# Patient Record
Sex: Female | Born: 1960
Health system: Southern US, Community
[De-identification: ages and names within clinical notes are randomized; demographics above are authoritative.]

## PROBLEM LIST (undated history)

## (undated) DIAGNOSIS — E785 Hyperlipidemia, unspecified: Secondary | ICD-10-CM

## (undated) HISTORY — PX: TOTAL VAGINAL HYSTERECTOMY: SHX2548

## (undated) HISTORY — DX: Hyperlipidemia, unspecified: E78.5

## (undated) HISTORY — PX: TUBAL LIGATION: SHX77

## (undated) HISTORY — PX: OOPHORECTOMY: SHX86

## (undated) HISTORY — PX: VAGINAL HYSTERECTOMY: SUR661

## (undated) HISTORY — PX: MYOMECTOMY: SHX85

---

## 2005-02-14 ENCOUNTER — Emergency Department (HOSPITAL_COMMUNITY): Admission: EM | Admit: 2005-02-14 | Discharge: 2005-02-14 | Payer: Self-pay | Admitting: Emergency Medicine

## 2007-08-07 ENCOUNTER — Ambulatory Visit: Payer: Self-pay | Admitting: Endocrinology

## 2007-10-15 ENCOUNTER — Ambulatory Visit: Payer: Self-pay | Admitting: Unknown Physician Specialty

## 2007-10-16 ENCOUNTER — Inpatient Hospital Stay: Payer: Self-pay | Admitting: Unknown Physician Specialty

## 2012-02-29 ENCOUNTER — Ambulatory Visit: Payer: Self-pay

## 2014-10-05 ENCOUNTER — Emergency Department: Payer: Self-pay | Admitting: Emergency Medicine

## 2015-09-18 LAB — HM PAP SMEAR: HM PAP: NEGATIVE

## 2017-03-15 ENCOUNTER — Ambulatory Visit: Payer: Self-pay | Admitting: Family Medicine

## 2017-06-07 ENCOUNTER — Encounter: Payer: Self-pay | Admitting: Obstetrics & Gynecology

## 2017-06-07 ENCOUNTER — Ambulatory Visit: Payer: Self-pay | Admitting: Obstetrics & Gynecology

## 2017-06-19 ENCOUNTER — Ambulatory Visit: Payer: Self-pay | Admitting: Nurse Practitioner

## 2017-07-12 ENCOUNTER — Ambulatory Visit (INDEPENDENT_AMBULATORY_CARE_PROVIDER_SITE_OTHER): Payer: Self-pay | Admitting: Nurse Practitioner

## 2017-07-12 ENCOUNTER — Encounter: Payer: Self-pay | Admitting: Nurse Practitioner

## 2017-07-12 VITALS — BP 119/77 | HR 71 | Temp 98.3°F | Ht 62.0 in | Wt 164.4 lb

## 2017-07-12 DIAGNOSIS — G8929 Other chronic pain: Secondary | ICD-10-CM | POA: Insufficient documentation

## 2017-07-12 DIAGNOSIS — M545 Low back pain, unspecified: Secondary | ICD-10-CM

## 2017-07-12 DIAGNOSIS — M79605 Pain in left leg: Secondary | ICD-10-CM | POA: Diagnosis not present

## 2017-07-12 DIAGNOSIS — Z7689 Persons encountering health services in other specified circumstances: Secondary | ICD-10-CM | POA: Diagnosis not present

## 2017-07-12 NOTE — Assessment & Plan Note (Signed)
Pain onset 3 months ago.  No limitations to daily activities, but pt has pain w/ change in positions from lying to sitting/standing. Pain likely self-limited.  Muscle strain possible complicated by sedentary lifestyle.  Plan:  1. Treat with NSAIDs (acetaminophen and ibuprofen).  Discussed alternate dosing and max dosing. 2. Apply heat and/or ice to affected area. 3. May also apply a muscle rub with lidocaine after heat or ice. 4. Discussed self massage w/ tennis ball 5. Recommend PT and placed referral. 6. Follow up 4 weeks as needed.

## 2017-07-12 NOTE — Progress Notes (Signed)
I have reviewed this encounter including the documentation in this note and/or discussed this patient with the provider, Cassell Smiles, AGPCNP-BC. I am certifying that I agree with the content of this note as supervising physician.  Nobie Putnam, [facility] 07/12/2017, 11:56 AM

## 2017-07-12 NOTE — Progress Notes (Signed)
Subjective:    Patient ID: Sara Greer, female    DOB: 07-25-1961, 56 y.o.   MRN: [id number]  Sara Greer is a 56 y.o. female presenting on 07/12/2017 for Establish Care (intermittent left leg stiffness, pain x 2 mths )   HPI Establish Care New Provider Pt last seen by PCP 2 years ago.  Obtain records from "Dr. Jerilynn Mages" [place] [facility]. Pt states she will call with his name later.  Last PAP [facility] 09/2015 - normal and no history of cervical cancer prior to hysterectomy. Mammogram 09/2015 - normal  Left Leg/Back Pain Pain onset 2-3 months, back pain started.  No recall of injury. Sharp w/ movements to stand up after lying down, aching at other times. - Is unable to cross left leg over right leg.  Hesitates w/ movement and has to manually place her leg over her right.  Tightness of lateral leg. - No limitations of day to day activities. - No regular pain management or medications.  Past Medical History:  Diagnosis Date  . Hyperlipemia    Past Surgical History:  Procedure Laterality Date  . CESAREAN SECTION    . MYOMECTOMY    . OOPHORECTOMY    . TUBAL LIGATION    . VAGINAL HYSTERECTOMY     Social History   Social History  . Marital status: Single    Spouse name: N/A  . Number of children: N/A  . Years of education: N/A   Occupational History  . Not on file.   Social History Main Topics  . Smoking status: Never Smoker  . Smokeless tobacco: Never Used  . Alcohol use Yes     Comment: rarely  . Drug use: No  . Sexual activity: No   Other Topics Concern  . Not on file   Social History Narrative  . No narrative on file   Family History  Problem Relation Age of Onset  . Hypertension Mother   . Gout Mother   . Breast cancer Maternal Aunt 60  . Stroke Maternal Aunt   . Sickle cell trait Other   . Diabetes Other   . Alcohol abuse Father   . Kidney disease Brother   . Heart attack Paternal Aunt   . Colon cancer Paternal Uncle   .  Alcohol abuse Brother   . Alcohol abuse Brother    No current outpatient prescriptions on file prior to visit.   No current facility-administered medications on file prior to visit.     Review of Systems  HENT:       Gum disease  Musculoskeletal: Positive for back pain and myalgias.  Skin:       Keloid in pelvic area  All other systems reviewed and are negative.  Per HPI unless specifically indicated above      Objective:    BP 119/77 (BP Location: Right Arm, Patient Position: Sitting, Cuff Size: Normal)   Pulse 71   Temp 98.3 F (36.8 C) (Oral)   Ht 5\' 2"  (1.575 m)   Wt 164 lb 6.4 oz (74.6 kg)   BMI 30.07 kg/m   Wt Readings from Last 3 Encounters:  07/12/17 164 lb 6.4 oz (74.6 kg)    Physical Exam  Constitutional: She is oriented to person, place, and time. She appears well-developed and well-nourished. No distress.  HENT:  Head: Normocephalic and atraumatic.  Nose: Nose normal.  Mouth/Throat: Oropharynx is clear and moist.  Neck: Normal range of motion. Neck supple. No JVD present. No  tracheal deviation present. No thyromegaly present.  Cardiovascular: Normal rate, regular rhythm, normal heart sounds and intact distal pulses.   Pulmonary/Chest: Effort normal and breath sounds normal. No respiratory distress.  Musculoskeletal:  Low Back Inspection: Normal appearance, no spinal deformity, symmetrical. Palpation: No tenderness over spinous processes. Bilateral lumbar paraspinal muscles non-tender and with hypertonicity. ROM: Full active ROM forward flex / back extension, rotation L/R without discomfort Special Testing: Seated SLR with reproduced localized L back pain but negative for radicular pain.  Strength: Bilateral hip flex/ext 5/5, knee flex/ext 5/5, ankle dorsiflex/plantarflex 5/5 Neurovascular: intact distal sensation to light touch, normal DTR   Lymphadenopathy:    She has no cervical adenopathy.  Neurological: She is alert and oriented to person, place,  and time. She has normal reflexes. No cranial nerve deficit.  Skin: Skin is warm and dry.  Psychiatric: She has a normal mood and affect. Her behavior is normal. Judgment and thought content normal.  Vitals reviewed.   No results found for this or any previous visit.    Assessment & Plan:   Problem List Items Addressed This Visit      Other   Chronic left-sided low back pain without sciatica    Pain onset 3 months ago.  No limitations to daily activities, but pt has pain w/ change in positions from lying to sitting/standing. Pain likely self-limited.  Muscle strain possible complicated by sedentary lifestyle.  Plan:  1. Treat with NSAIDs (acetaminophen and ibuprofen).  Discussed alternate dosing and max dosing. 2. Apply heat and/or ice to affected area. 3. May also apply a muscle rub with lidocaine after heat or ice. 4. Discussed self massage w/ tennis ball 5. Recommend PT and placed referral. 6. Follow up 4 weeks as needed.       Relevant Orders   Ambulatory referral to Physical Therapy    Other Visit Diagnoses    Leg pain, lateral, left    -  Primary   See A/P for Back pain.   Relevant Orders   Ambulatory referral to Physical Therapy   Encounter to establish care       Pt without recent medical care.  Last PCP visit was 2 years ago.   Plan: 1. Pt due for physical w/ labs 2-3 days prior.  Plan for October 2018.        Follow up plan: Return in about 2 months (around 09/11/2017) for annual physical.  Cassell Smiles, DNP, AGPCNP-BC Adult Gerontology Primary Care Nurse Practitioner [facility] 07/12/2017, 9:06 AM

## 2017-07-12 NOTE — Patient Instructions (Addendum)
Arynn, Thank you for coming in to clinic today.  1. For your back and leg pain: - you do have muscle tension.  This can be relieved w/ self massage using your hands or a ball.  - I recommend Physical Therapy and have placed this referral.  - May start taking Tylenol extra strength 1 to 2 tablets every 6-8 hours for aches or fever/chills for next few days as needed.  Do not take more than 3,000 mg in 24 hours from all medicines.  May take Ibuprofen as well if tolerated 200-400mg  every 8 hours as needed. - Use heat and ice.  Apply this for 15 minutes at a time 6-8 times per day.   - Muscle rub with lidocaine.  Avoid using this with heat and ice to avoid burns.   Please schedule a follow-up appointment with Cassell Smiles, AGNP. Follow up in 4 weeks if no improvement and in October for your Physical.  If you have any other questions or concerns, please feel free to call the clinic or send a message through [place]. You may also schedule an earlier appointment if necessary.  You will receive a survey after today's visit either digitally by e-mail or paper by [employer]. Your experiences and feedback matter to [place].  Please respond so we know how we are doing as we provide care for you.   Cassell Smiles, DNP, AGNP-BC Adult Gerontology Nurse Practitioner [facility], [facility]          Low Back Pain Exercises  See other page with pictures of each exercise.  Start with 1 or 2 of these exercises that you are most comfortable with. Do not do any exercises that cause you significant worsening pain. Some of these may cause some "stretching soreness" but it should go away after you stop the exercise, and get better over time. Gradually increase up to 3-4 exercises as tolerated.  Standing hamstring stretch: Place the heel of your leg on a stool about 15 inches high. Keep your knee straight. Lean forward, bending at the hips until you feel a mild stretch in the back of your thigh.  Make sure you do not roll your shoulders and bend at the waist when doing this or you will stretch your lower back instead. Hold the stretch for 15 to 30 seconds. Repeat 3 times. Repeat the same stretch on your other leg.  Cat and camel: Get down on your hands and knees. Let your stomach sag, allowing your back to curve downward. Hold this position for 5 seconds. Then arch your back and hold for 5 seconds. Do 3 sets of 10.  Quadriped Arm/Leg Raises: Get down on your hands and knees. Tighten your abdominal muscles to stiffen your spine. While keeping your abdominals tight, raise one arm and the opposite leg away from you. Hold this position for 5 seconds. Lower your arm and leg slowly and alternate sides. Do this 10 times on each side.  Pelvic tilt: Lie on your back with your knees bent and your feet flat on the floor. Tighten your abdominal muscles and push your lower back into the floor. Hold this position for 5 seconds, then relax. Do 3 sets of 10.  Partial curl: Lie on your back with your knees bent and your feet flat on the floor. Tighten your stomach muscles and flatten your back against the floor. Tuck your chin to your chest. With your hands stretched out in front of you, curl your upper body forward  until your shoulders clear the floor. Hold this position for 3 seconds. Don't hold your breath. It helps to breathe out as you lift your shoulders up. Relax. Repeat 10 times. Build to 3 sets of 10. To challenge yourself, clasp your hands behind your head and keep your elbows out to the side.  Lower trunk rotation: Lie on your back with your knees bent and your feet flat on the floor. Tighten your abdominal muscles and push your lower back into the floor. Keeping your shoulders down flat, gently rotate your legs to one side, then the other as far as you can. Repeat 10 to 20 times.  Single knee to chest stretch: Lie on your back with your legs straight out in front of you. Bring one knee up to your  chest and grasp the back of your thigh. Pull your knee toward your chest, stretching your buttock muscle. Hold this position for 15 to 30 seconds and return to the starting position. Repeat 3 times on each side.  Double knee to chest: Lie on your back with your knees bent and your feet flat on the floor. Tighten your abdominal muscles and push your lower back into the floor. Pull both knees up to your chest. Hold for 5 seconds and repeat 10 to 20 times.

## 2017-07-13 ENCOUNTER — Ambulatory Visit: Payer: Self-pay | Admitting: Nurse Practitioner

## 2017-08-01 ENCOUNTER — Encounter: Payer: BC Managed Care – PPO | Admitting: Nurse Practitioner

## 2017-12-27 ENCOUNTER — Ambulatory Visit: Payer: BC Managed Care – PPO | Admitting: Family Medicine

## 2017-12-27 ENCOUNTER — Encounter: Payer: Self-pay | Admitting: Family Medicine

## 2017-12-27 VITALS — BP 114/71 | HR 60 | Temp 98.1°F | Ht 61.0 in | Wt 160.0 lb

## 2017-12-27 DIAGNOSIS — Z1159 Encounter for screening for other viral diseases: Secondary | ICD-10-CM

## 2017-12-27 DIAGNOSIS — L910 Hypertrophic scar: Secondary | ICD-10-CM | POA: Diagnosis not present

## 2017-12-27 DIAGNOSIS — Z0000 Encounter for general adult medical examination without abnormal findings: Secondary | ICD-10-CM

## 2017-12-27 DIAGNOSIS — Z1211 Encounter for screening for malignant neoplasm of colon: Secondary | ICD-10-CM

## 2017-12-27 DIAGNOSIS — Z114 Encounter for screening for human immunodeficiency virus [HIV]: Secondary | ICD-10-CM

## 2017-12-27 DIAGNOSIS — E785 Hyperlipidemia, unspecified: Secondary | ICD-10-CM

## 2017-12-27 LAB — UA/M W/RFLX CULTURE, ROUTINE
Bilirubin, UA: NEGATIVE
Glucose, UA: NEGATIVE
Ketones, UA: NEGATIVE
Leukocytes, UA: NEGATIVE
Nitrite, UA: NEGATIVE
PH UA: 5.5 (ref 5.0–7.5)
PROTEIN UA: NEGATIVE
RBC, UA: NEGATIVE
Specific Gravity, UA: <1.005 — ABNORMAL LOW (ref 1.005–1.030)
UUROB: 0.2 mg/dL (ref 0.2–1.0)

## 2017-12-27 NOTE — Progress Notes (Deleted)
   BP 114/71 (BP Location: Left Arm, Patient Position: Sitting, Cuff Size: Normal)   Pulse 60   Temp 98.1 F (36.7 C) (Oral)   Ht 5\' 1"  (1.549 m)   Wt 160 lb (72.6 kg)   SpO2 97%   BMI 30.23 kg/m    Subjective:    Patient ID: Sara Greer, female    DOB: 1961-02-02, 57 y.o.   MRN: [id number]  HPI: Sara Greer is a 57 y.o. female  Chief Complaint  Patient presents with  . Establish Care    Patient states no complaints, but would like physical.   35 year hx (since childbirth) of a small bump in private area that in the past year has become very bothersome for her.   Goes to [place], will re-establish there and get her mammogram and pap (if needed, pt can't remember the extent of her hysterectomy) through them. Wants cologaurd instead of colonoscopy    Relevant past medical, surgical, family and social history reviewed and updated as indicated. Interim medical history since our last visit reviewed. Allergies and medications reviewed and updated.  Review of Systems  Per HPI unless specifically indicated above     Objective:    BP 114/71 (BP Location: Left Arm, Patient Position: Sitting, Cuff Size: Normal)   Pulse 60   Temp 98.1 F (36.7 C) (Oral)   Ht 5\' 1"  (1.549 m)   Wt 160 lb (72.6 kg)   SpO2 97%   BMI 30.23 kg/m   Wt Readings from Last 3 Encounters:  12/27/17 160 lb (72.6 kg)  07/12/17 164 lb 6.4 oz (74.6 kg)    Physical Exam  No results found for this or any previous visit.    Assessment & Plan:   Problem List Items Addressed This Visit    None       Follow up plan: No Follow-up on file.

## 2017-12-28 LAB — LIPID PANEL W/O CHOL/HDL RATIO
CHOLESTEROL TOTAL: 350 mg/dL — AB (ref 100–199)
HDL: 82 mg/dL (ref >39)
LDL CALC: 253 mg/dL — AB (ref 0–99)
Triglycerides: 75 mg/dL (ref 0–149)
VLDL CHOLESTEROL CAL: 15 mg/dL (ref 5–40)

## 2017-12-28 LAB — COMPREHENSIVE METABOLIC PANEL
ALBUMIN: 4 g/dL (ref 3.5–5.5)
ALT: 23 IU/L (ref 0–32)
AST: 25 IU/L (ref 0–40)
Albumin/Globulin Ratio: 1.1 — ABNORMAL LOW (ref 1.2–2.2)
Alkaline Phosphatase: 64 IU/L (ref 39–117)
BUN / CREAT RATIO: 17 (ref 9–23)
BUN: 12 mg/dL (ref 6–24)
Bilirubin Total: <0.2 mg/dL (ref 0.0–1.2)
CALCIUM: 9.4 mg/dL (ref 8.7–10.2)
CO2: 21 mmol/L (ref 20–29)
CREATININE: 0.69 mg/dL (ref 0.57–1.00)
Chloride: 103 mmol/L (ref 96–106)
GFR, EST AFRICAN AMERICAN: 114 mL/min/{1.73_m2} (ref >59)
GFR, EST NON AFRICAN AMERICAN: 99 mL/min/{1.73_m2} (ref >59)
GLUCOSE: 81 mg/dL (ref 65–99)
Globulin, Total: 3.8 g/dL (ref 1.5–4.5)
Potassium: 4.3 mmol/L (ref 3.5–5.2)
Sodium: 142 mmol/L (ref 134–144)
TOTAL PROTEIN: 7.8 g/dL (ref 6.0–8.5)

## 2017-12-28 LAB — CBC WITH DIFFERENTIAL/PLATELET
BASOS ABS: 0 10*3/uL (ref 0.0–0.2)
BASOS: 0 %
EOS (ABSOLUTE): 0.1 10*3/uL (ref 0.0–0.4)
Eos: 2 %
HEMOGLOBIN: 13.8 g/dL (ref 11.1–15.9)
Hematocrit: 40.6 % (ref 34.0–46.6)
IMMATURE GRANS (ABS): 0 10*3/uL (ref 0.0–0.1)
IMMATURE GRANULOCYTES: 0 %
LYMPHS: 37 %
Lymphocytes Absolute: 2.2 10*3/uL (ref 0.7–3.1)
MCH: 28.1 pg (ref 26.6–33.0)
MCHC: 34 g/dL (ref 31.5–35.7)
MCV: 83 fL (ref 79–97)
MONOCYTES: 4 %
Monocytes Absolute: 0.3 10*3/uL (ref 0.1–0.9)
Neutrophils Absolute: 3.4 10*3/uL (ref 1.4–7.0)
Neutrophils: 57 %
Platelets: 305 10*3/uL (ref 150–379)
RBC: 4.91 x10E6/uL (ref 3.77–5.28)
RDW: 14.5 % (ref 12.3–15.4)
WBC: 5.9 10*3/uL (ref 3.4–10.8)

## 2017-12-28 LAB — TSH: TSH: 1.6 u[IU]/mL (ref 0.450–4.500)

## 2017-12-28 LAB — HIV ANTIBODY (ROUTINE TESTING W REFLEX): HIV Screen 4th Generation wRfx: NONREACTIVE

## 2017-12-28 LAB — HEPATITIS C ANTIBODY: Hep C Virus Ab: 0.1 s/co ratio (ref 0.0–0.9)

## 2017-12-30 DIAGNOSIS — E785 Hyperlipidemia, unspecified: Secondary | ICD-10-CM | POA: Insufficient documentation

## 2017-12-30 MED ORDER — ATORVASTATIN CALCIUM 40 MG PO TABS: 40.0000 mg | ORAL_TABLET | Freq: Every day | ORAL | 1 refills | Status: DC

## 2017-12-30 NOTE — Assessment & Plan Note (Signed)
Await fasting lipid results. Diet controlled

## 2017-12-30 NOTE — Patient Instructions (Signed)
Follow up in 1 year.

## 2017-12-30 NOTE — Progress Notes (Signed)
BP 114/71 (BP Location: Left Arm, Patient Position: Sitting, Cuff Size: Normal)   Pulse 60   Temp 98.1 F (36.7 C) (Oral)   Ht 5\' 1"  (1.549 m)   Wt 160 lb (72.6 kg)   SpO2 97%   BMI 30.23 kg/m    Subjective:    Patient ID: Sara Greer, female    DOB: 01/09/1961, 57 y.o.   MRN: [id number]  HPI: Sara Greer is a 57 y.o. female presenting on 12/27/2017 for comprehensive medical examination. Current medical complaints include:see below  Pt here today to establish care. Would like to have her CPE done today as well, came fasting.   Only concern is 35 year hx (since childbirth with her first child) of a small keloid in private area that in the past year has become very bothersome for her. Growing in size, itchy at times. Was referred to a Dermatologist for management in the past but never got to go.   Goes to [facility] for [employer], will re-establish there and get her mammogram and pap (if needed, pt can't remember the extent of her hysterectomy) through them.  Wants cologuard instead of colonoscopy.   Depression Screen done today and results listed below:  Depression screen [facility] 2/9 07/12/2017  Decreased Interest 1  Down, Depressed, Hopeless 0  PHQ - 2 Score 1  Altered sleeping 2  Tired, decreased energy 1  Change in appetite 1  Feeling bad or failure about yourself  0  Trouble concentrating 0  Moving slowly or fidgety/restless 0  Suicidal thoughts 0  PHQ-9 Score 5    The patient does not have a history of falls. I did not complete a risk assessment for falls. A plan of care for falls was not documented.   Past Medical History:  Past Medical History:  Diagnosis Date  . Hyperlipemia     Surgical History:  Past Surgical History:  Procedure Laterality Date  . CESAREAN SECTION    . MYOMECTOMY    . OOPHORECTOMY    . TUBAL LIGATION    . VAGINAL HYSTERECTOMY      Medications:  No current outpatient medications on file prior to visit.    No current facility-administered medications on file prior to visit.     Allergies:  No Known Allergies  Social History:  Social History   Socioeconomic History  . Marital status: Single    Spouse name: Not on file  . Number of children: Not on file  . Years of education: Not on file  . Highest education level: Not on file  Social Needs  . Financial resource strain: Not on file  . Food insecurity - worry: Not on file  . Food insecurity - inability: Not on file  . Transportation needs - medical: Not on file  . Transportation needs - non-medical: Not on file  Occupational History  . Not on file  Tobacco Use  . Smoking status: Never Smoker  . Smokeless tobacco: Never Used  Substance and Sexual Activity  . Alcohol use: Yes    Comment: rarely  . Drug use: No  . Sexual activity: No  Other Topics Concern  . Not on file  Social History Narrative  . Not on file   Social History   Tobacco Use  Smoking Status Never Smoker  Smokeless Tobacco Never Used   Social History   Substance and Sexual Activity  Alcohol Use Yes   Comment: rarely    Family History:  Family History  Problem Relation Age of Onset  . Hypertension Mother   . Gout Mother   . Heart disease Mother   . Breast cancer Maternal Aunt 60  . Stroke Maternal Aunt   . Sickle cell trait Other   . Diabetes Other   . Alcohol abuse Father   . Kidney disease Brother   . Heart attack Paternal Aunt   . Colon cancer Paternal Uncle   . Alcohol abuse Brother   . Alcohol abuse Brother     Past medical history, surgical history, medications, allergies, family history and social history reviewed with patient today and changes made to appropriate areas of the chart.   Review of Systems - General ROS: negative Psychological ROS: negative Ophthalmic ROS: negative ENT ROS: negative Allergy and Immunology ROS: negative Breast ROS: negative for breast lumps Respiratory ROS: no cough, shortness of breath, or  wheezing Cardiovascular ROS: no chest pain or dyspnea on exertion Gastrointestinal ROS: no abdominal pain, change in bowel habits, or black or bloody stools Genito-Urinary ROS: no dysuria, trouble voiding, or hematuria Musculoskeletal ROS: negative Neurological ROS: no TIA or stroke symptoms Dermatological ROS: keloid All other ROS negative except what is listed above and in the HPI.      Objective:    BP 114/71 (BP Location: Left Arm, Patient Position: Sitting, Cuff Size: Normal)   Pulse 60   Temp 98.1 F (36.7 C) (Oral)   Ht 5\' 1"  (1.549 m)   Wt 160 lb (72.6 kg)   SpO2 97%   BMI 30.23 kg/m   Wt Readings from Last 3 Encounters:  12/27/17 160 lb (72.6 kg)  07/12/17 164 lb 6.4 oz (74.6 kg)    Physical Exam  Constitutional: She is oriented to person, place, and time. She appears well-developed and well-nourished. No distress.  HENT:  Head: Atraumatic.  Right Ear: External ear normal.  Left Ear: External ear normal.  Nose: Nose normal.  Mouth/Throat: Oropharynx is clear and moist. No oropharyngeal exudate.  Eyes: Conjunctivae are normal. Pupils are equal, round, and reactive to light. No scleral icterus.  Neck: Normal range of motion. Neck supple. No thyromegaly present.  Cardiovascular: Normal rate, regular rhythm, normal heart sounds and intact distal pulses.  Pulmonary/Chest: Effort normal and breath sounds normal. No respiratory distress.  Breast exam done elsewhere  Abdominal: Soft. Bowel sounds are normal. She exhibits no mass. There is no tenderness.  Genitourinary:  Genitourinary Comments: GU exam done elsewhere  Musculoskeletal: Normal range of motion. She exhibits no edema or tenderness.  Lymphadenopathy:    She has no cervical adenopathy.  Neurological: She is alert and oriented to person, place, and time. No cranial nerve deficit.  Skin: Skin is warm and dry. No rash noted.  Keloid, groin area  Psychiatric: She has a normal mood and affect. Her behavior is  normal.  Nursing note and vitals reviewed.  Results for orders placed or performed in visit on 12/27/17  CBC with Differential/Platelet  Result Value Ref Range   WBC 5.9 3.4 - 10.8 x10E3/uL   RBC 4.91 3.77 - 5.28 x10E6/uL   Hemoglobin 13.8 11.1 - 15.9 g/dL   Hematocrit 40.6 34.0 - 46.6 %   MCV 83 79 - 97 fL   MCH 28.1 26.6 - 33.0 pg   MCHC 34.0 31.5 - 35.7 g/dL   RDW 14.5 12.3 - 15.4 %   Platelets 305 150 - 379 x10E3/uL   Neutrophils 57 Not Estab. %   Lymphs 37 Not Estab. %   Monocytes  4 Not Estab. %   Eos 2 Not Estab. %   Basos 0 Not Estab. %   Neutrophils Absolute 3.4 1.4 - 7.0 x10E3/uL   Lymphocytes Absolute 2.2 0.7 - 3.1 x10E3/uL   Monocytes Absolute 0.3 0.1 - 0.9 x10E3/uL   EOS (ABSOLUTE) 0.1 0.0 - 0.4 x10E3/uL   Basophils Absolute 0.0 0.0 - 0.2 x10E3/uL   Immature Granulocytes 0 Not Estab. %   Immature Grans (Abs) 0.0 0.0 - 0.1 x10E3/uL  Comprehensive metabolic panel  Result Value Ref Range   Glucose 81 65 - 99 mg/dL   BUN 12 6 - 24 mg/dL   Creatinine, Ser 0.69 0.57 - 1.00 mg/dL   GFR calc non Af Amer 99 >59 mL/min/1.73   GFR calc Af Amer 114 >59 mL/min/1.73   BUN/Creatinine Ratio 17 9 - 23   Sodium 142 134 - 144 mmol/L   Potassium 4.3 3.5 - 5.2 mmol/L   Chloride 103 96 - 106 mmol/L   CO2 21 20 - 29 mmol/L   Calcium 9.4 8.7 - 10.2 mg/dL   Total Protein 7.8 6.0 - 8.5 g/dL   Albumin 4.0 3.5 - 5.5 g/dL   Globulin, Total 3.8 1.5 - 4.5 g/dL   Albumin/Globulin Ratio 1.1 (L) 1.2 - 2.2   Bilirubin Total <0.2 0.0 - 1.2 mg/dL   Alkaline Phosphatase 64 39 - 117 IU/L   AST 25 0 - 40 IU/L   ALT 23 0 - 32 IU/L  Hepatitis C antibody  Result Value Ref Range   Hep C Virus Ab 0.1 0.0 - 0.9 s/co ratio  HIV antibody  Result Value Ref Range   HIV Screen 4th Generation wRfx Non Reactive Non Reactive  Lipid Panel w/o Chol/HDL Ratio  Result Value Ref Range   Cholesterol, Total 350 (H) 100 - 199 mg/dL   Triglycerides 75 0 - 149 mg/dL   HDL 82 >39 mg/dL   VLDL Cholesterol Cal  15 5 - 40 mg/dL   LDL Calculated 253 (H) 0 - 99 mg/dL   Comment: Comment   TSH  Result Value Ref Range   TSH 1.600 0.450 - 4.500 uIU/mL  UA/M w/rflx Culture, Routine  Result Value Ref Range   Specific Gravity, UA <1.005 (L) 1.005 - 1.030   pH, UA 5.5 5.0 - 7.5   Color, UA Straw Yellow   Appearance Ur Hazy (A) Clear   Leukocytes, UA Negative Negative   Protein, UA Negative Negative/Trace   Glucose, UA Negative Negative   Ketones, UA Negative Negative   RBC, UA Negative Negative   Bilirubin, UA Negative Negative   Urobilinogen, Ur 0.2 0.2 - 1.0 mg/dL   Nitrite, UA Negative Negative      Assessment & Plan:   Problem List Items Addressed This Visit      Other   Hyperlipemia    Await fasting lipid results. Diet controlled      Relevant Medications   atorvastatin (LIPITOR) 40 MG tablet    Other Visit Diagnoses    Keloid    -  Primary   Will place new Dermatology referral as it's becoming quite bothersome for her   Relevant Orders   Ambulatory referral to Dermatology   Annual physical exam       Relevant Orders   CBC with Differential/Platelet (Completed)   Comprehensive metabolic panel (Completed)   Lipid Panel w/o Chol/HDL Ratio (Completed)   TSH (Completed)   UA/M w/rflx Culture, Routine (Completed)   Encounter for hepatitis C screening test for  low risk patient       Relevant Orders   Hepatitis C antibody (Completed)   Screening for HIV (human immunodeficiency virus)       Relevant Orders   HIV antibody (Completed)   Screening for colon cancer       Relevant Orders   Cologuard       Follow up plan: Return in about 1 year (around 12/27/2018) for CPE.   LABORATORY TESTING:  - Pap smear: pt will address this with [facility], she is unsure of the extent of her Hysterectomy  IMMUNIZATIONS:   - Tdap: Tetanus vaccination status reviewed: postponed. - Influenza: Refused  SCREENING: -Mammogram: Done elsewhere  - Colonoscopy: cologuard ordered   PATIENT  COUNSELING:   Advised to take 1 mg of folate supplement per day if capable of pregnancy.   Sexuality: Discussed sexually transmitted diseases, partner selection, use of condoms, avoidance of unintended pregnancy  and contraceptive alternatives.   Advised to avoid cigarette smoking.  I discussed with the patient that most people either abstain from alcohol or drink within safe limits (<=14/week and <=4 drinks/occasion for males, <=7/weeks and <= 3 drinks/occasion for females) and that the risk for alcohol disorders and other health effects rises proportionally with the number of drinks per week and how often a drinker exceeds daily limits.  Discussed cessation/primary prevention of drug use and availability of treatment for abuse.   Diet: Encouraged to adjust caloric intake to maintain  or achieve ideal body weight, to reduce intake of dietary saturated fat and total fat, to limit sodium intake by avoiding high sodium foods and not adding table salt, and to maintain adequate dietary potassium and calcium preferably from fresh fruits, vegetables, and low-fat dairy products.    stressed the importance of regular exercise  Injury prevention: Discussed safety belts, safety helmets, smoke detector, smoking near bedding or upholstery.   Dental health: Discussed importance of regular tooth brushing, flossing, and dental visits.   NEXT PREVENTATIVE PHYSICAL DUE IN 1 YEAR. Return in about 1 year (around 12/27/2018) for CPE.

## 2018-03-14 ENCOUNTER — Encounter: Payer: Self-pay | Admitting: Family Medicine

## 2018-03-19 ENCOUNTER — Ambulatory Visit (INDEPENDENT_AMBULATORY_CARE_PROVIDER_SITE_OTHER): Payer: BC Managed Care – PPO | Admitting: Family Medicine

## 2018-03-19 VITALS — BP 135/84 | HR 56 | Temp 98.3°F | Wt 160.8 lb

## 2018-03-19 DIAGNOSIS — Z23 Encounter for immunization: Secondary | ICD-10-CM

## 2018-03-19 DIAGNOSIS — J309 Allergic rhinitis, unspecified: Secondary | ICD-10-CM | POA: Diagnosis not present

## 2018-03-19 DIAGNOSIS — Z0184 Encounter for antibody response examination: Secondary | ICD-10-CM | POA: Diagnosis not present

## 2018-03-19 MED ORDER — FLUTICASONE PROPIONATE 50 MCG/ACT NA SUSP
2.0000 | Freq: Two times a day (BID) | NASAL | 6 refills | Status: DC
Start: 2018-03-19 — End: 2019-12-15

## 2018-03-19 MED ORDER — CETIRIZINE HCL 10 MG PO TABS
10.0000 mg | ORAL_TABLET | Freq: Every day | ORAL | 11 refills | Status: DC
Start: 2018-03-19 — End: 2018-04-04

## 2018-03-19 NOTE — Progress Notes (Signed)
BP 135/84 (BP Location: Left Arm, Patient Position: Sitting, Cuff Size: Normal)   Pulse (!) 56   Temp 98.3 F (36.8 C) (Oral)   Wt 160 lb 12.8 oz (72.9 kg)   SpO2 100%   BMI 30.38 kg/m    Subjective:    Patient ID: Sara Greer, female    DOB: 1961/07/31, 57 y.o.   MRN: [id number]  HPI: Sara Greer is a 57 y.o. female  Chief Complaint  Patient presents with  . School Screen    In school for phlebotomy. Needs HepB titer and TDap injection.   Pt here today for school requirements of several immunizations. Has not had a Tdap in many years, and states she needs to be checked for hepatitis B immunity as she does not know if she's been vaccinated. Also needs TB skin test.   States she has also been dealing with several weeks of watery discharge from b/l eyes, rhinorrhea, and sneezing. Not currently on any allergy regimen but knows she does have seasonal allergies to pollen. Denies fevers, chills, facial pain/pressure, Cp, SOB.   Past Medical History:  Diagnosis Date  . Hyperlipemia    Social History   Socioeconomic History  . Marital status: Single    Spouse name: Not on file  . Number of children: Not on file  . Years of education: Not on file  . Highest education level: Not on file  Occupational History  . Not on file  Social Needs  . Financial resource strain: Not on file  . Food insecurity:    Worry: Not on file    Inability: Not on file  . Transportation needs:    Medical: Not on file    Non-medical: Not on file  Tobacco Use  . Smoking status: Never Smoker  . Smokeless tobacco: Never Used  Substance and Sexual Activity  . Alcohol use: Yes    Comment: rarely  . Drug use: No  . Sexual activity: Never  Lifestyle  . Physical activity:    Days per week: Not on file    Minutes per session: Not on file  . Stress: Not on file  Relationships  . Social connections:    Talks on phone: Not on file    Gets together: Not on file   Attends religious service: Not on file    Active member of club or organization: Not on file    Attends meetings of clubs or organizations: Not on file    Relationship status: Not on file  . Intimate partner violence:    Fear of current or ex partner: Not on file    Emotionally abused: Not on file    Physically abused: Not on file    Forced sexual activity: Not on file  Other Topics Concern  . Not on file  Social History Narrative  . Not on file   Relevant past medical, surgical, family and social history reviewed and updated as indicated. Interim medical history since our last visit reviewed. Allergies and medications reviewed and updated.  Review of Systems  Per HPI unless specifically indicated above     Objective:    BP 135/84 (BP Location: Left Arm, Patient Position: Sitting, Cuff Size: Normal)   Pulse (!) 56   Temp 98.3 F (36.8 C) (Oral)   Wt 160 lb 12.8 oz (72.9 kg)   SpO2 100%   BMI 30.38 kg/m   Wt Readings from Last 3 Encounters:  03/19/18 160 lb 12.8 oz (72.9 kg)  12/27/17 160 lb (72.6 kg)  07/12/17 164 lb 6.4 oz (74.6 kg)    Physical Exam  Constitutional: She is oriented to person, place, and time. She appears well-developed and well-nourished. No distress.  HENT:  Head: Atraumatic.  Right Ear: External ear normal.  Left Ear: External ear normal.  Mouth/Throat: Oropharynx is clear and moist. No oropharyngeal exudate.  Nasal mucosa boggy and injected, rhinorrhea present  Eyes: Pupils are equal, round, and reactive to light. Conjunctivae are normal. No scleral icterus.  Neck: Normal range of motion. Neck supple.  Cardiovascular: Normal rate and normal heart sounds.  Pulmonary/Chest: Effort normal and breath sounds normal.  Musculoskeletal: Normal range of motion.  Neurological: She is alert and oriented to person, place, and time.  Skin: Skin is warm and dry.  Psychiatric: She has a normal mood and affect. Her behavior is normal.  Nursing note and vitals  reviewed.   Results for orders placed or performed in visit on 12/27/17  CBC with Differential/Platelet  Result Value Ref Range   WBC 5.9 3.4 - 10.8 x10E3/uL   RBC 4.91 3.77 - 5.28 x10E6/uL   Hemoglobin 13.8 11.1 - 15.9 g/dL   Hematocrit 40.6 34.0 - 46.6 %   MCV 83 79 - 97 fL   MCH 28.1 26.6 - 33.0 pg   MCHC 34.0 31.5 - 35.7 g/dL   RDW 14.5 12.3 - 15.4 %   Platelets 305 150 - 379 x10E3/uL   Neutrophils 57 Not Estab. %   Lymphs 37 Not Estab. %   Monocytes 4 Not Estab. %   Eos 2 Not Estab. %   Basos 0 Not Estab. %   Neutrophils Absolute 3.4 1.4 - 7.0 x10E3/uL   Lymphocytes Absolute 2.2 0.7 - 3.1 x10E3/uL   Monocytes Absolute 0.3 0.1 - 0.9 x10E3/uL   EOS (ABSOLUTE) 0.1 0.0 - 0.4 x10E3/uL   Basophils Absolute 0.0 0.0 - 0.2 x10E3/uL   Immature Granulocytes 0 Not Estab. %   Immature Grans (Abs) 0.0 0.0 - 0.1 x10E3/uL  Comprehensive metabolic panel  Result Value Ref Range   Glucose 81 65 - 99 mg/dL   BUN 12 6 - 24 mg/dL   Creatinine, Ser 0.69 0.57 - 1.00 mg/dL   GFR calc non Af Amer 99 >59 mL/min/1.73   GFR calc Af Amer 114 >59 mL/min/1.73   BUN/Creatinine Ratio 17 9 - 23   Sodium 142 134 - 144 mmol/L   Potassium 4.3 3.5 - 5.2 mmol/L   Chloride 103 96 - 106 mmol/L   CO2 21 20 - 29 mmol/L   Calcium 9.4 8.7 - 10.2 mg/dL   Total Protein 7.8 6.0 - 8.5 g/dL   Albumin 4.0 3.5 - 5.5 g/dL   Globulin, Total 3.8 1.5 - 4.5 g/dL   Albumin/Globulin Ratio 1.1 (L) 1.2 - 2.2   Bilirubin Total <0.2 0.0 - 1.2 mg/dL   Alkaline Phosphatase 64 39 - 117 IU/L   AST 25 0 - 40 IU/L   ALT 23 0 - 32 IU/L  Hepatitis C antibody  Result Value Ref Range   Hep C Virus Ab 0.1 0.0 - 0.9 s/co ratio  HIV antibody  Result Value Ref Range   HIV Screen 4th Generation wRfx Non Reactive Non Reactive  Lipid Panel w/o Chol/HDL Ratio  Result Value Ref Range   Cholesterol, Total 350 (H) 100 - 199 mg/dL   Triglycerides 75 0 - 149 mg/dL   HDL 82 >39 mg/dL   VLDL Cholesterol Cal 15 5 - 40  mg/dL   LDL Calculated  253 (H) 0 - 99 mg/dL   Comment: Comment   TSH  Result Value Ref Range   TSH 1.600 0.450 - 4.500 uIU/mL  UA/M w/rflx Culture, Routine  Result Value Ref Range   Specific Gravity, UA <1.005 (L) 1.005 - 1.030   pH, UA 5.5 5.0 - 7.5   Color, UA Straw Yellow   Appearance Ur Hazy (A) Clear   Leukocytes, UA Negative Negative   Protein, UA Negative Negative/Trace   Glucose, UA Negative Negative   Ketones, UA Negative Negative   RBC, UA Negative Negative   Bilirubin, UA Negative Negative   Urobilinogen, Ur 0.2 0.2 - 1.0 mg/dL   Nitrite, UA Negative Negative      Assessment & Plan:   Problem List Items Addressed This Visit      Respiratory   Allergic rhinitis    Will start good allergy regimen with consistent use of zyrtec and flonase and monitor closely for improvement. Discussed supportive care with allergy eye drops,humidifier, sinus rinses prn       Other Visit Diagnoses    Immunity status testing    -  Primary   Will check for hepatitis B immunity and vaccinate if needed. [facility]. Tdap given today   Relevant Orders   Hepatitis B Surface AntiBODY       Follow up plan: Return if symptoms worsen or fail to improve.

## 2018-03-19 NOTE — Assessment & Plan Note (Signed)
Will start good allergy regimen with consistent use of zyrtec and flonase and monitor closely for improvement. Discussed supportive care with allergy eye drops,humidifier, sinus rinses prn

## 2018-03-19 NOTE — Patient Instructions (Addendum)
Immunization Summary  Patient Information   Patient Name Sara Greer, Sara Greer Sex Female DOB 31-Oct-1961    Tdap 03/19/2018 (57 y.o.)        Tdap Vaccine (Tetanus, Diphtheria and Pertussis): What You Need to Know 1. Why get vaccinated? Tetanus, diphtheria and pertussis are very serious diseases. Tdap vaccine can protect [place] from these diseases. And, Tdap vaccine given to pregnant women can protect newborn babies against pertussis. TETANUS (Lockjaw) is rare in the [place] today. It causes painful muscle tightening and stiffness, usually all over the body.  It can lead to tightening of muscles in the head and neck so you can't open your mouth, swallow, or sometimes even breathe. Tetanus kills about 1 out of 10 people who are infected even after receiving the best medical care.  DIPHTHERIA is also rare in the [place] today. It can cause a thick coating to form in the back of the throat.  It can lead to breathing problems, heart failure, paralysis, and death.  PERTUSSIS (Whooping Cough) causes severe coughing spells, which can cause difficulty breathing, vomiting and disturbed sleep.  It can also lead to weight loss, incontinence, and rib fractures. Up to 2 in 100 adolescents and 5 in 100 adults with pertussis are hospitalized or have complications, which could include pneumonia or death.  These diseases are caused by bacteria. Diphtheria and pertussis are spread from person to person through secretions from coughing or sneezing. Tetanus enters the body through cuts, scratches, or wounds. Before vaccines, as many as 200,000 cases of diphtheria, 200,000 cases of pertussis, and hundreds of cases of tetanus, were reported in the [place] each year. Since vaccination began, reports of cases for tetanus and diphtheria have dropped by about 99% and for pertussis by about 80%. 2. Tdap vaccine Tdap vaccine can protect adolescents and adults from tetanus, diphtheria, and  pertussis. One dose of Tdap is routinely given at age 48 or 23. People who did not get Tdap at that age should get it as soon as possible. Tdap is especially important for healthcare professionals and anyone having close contact with a baby younger than 12 months. Pregnant women should get a dose of Tdap during every pregnancy, to protect the newborn from pertussis. Infants are most at risk for severe, life-threatening complications from pertussis. Another vaccine, called Td, protects against tetanus and diphtheria, but not pertussis. A Td booster should be given every 10 years. Tdap may be given as one of these boosters if you have never gotten Tdap before. Tdap may also be given after a severe cut or burn to prevent tetanus infection. Your doctor or the person giving you the vaccine can give you more information. Tdap may safely be given at the same time as other vaccines. 3. Some people should not get this vaccine  A person who has ever had a life-threatening allergic reaction after a previous dose of any diphtheria, tetanus or pertussis containing vaccine, OR has a severe allergy to any part of this vaccine, should not get Tdap vaccine. Tell the person giving the vaccine about any severe allergies.  Anyone who had coma or long repeated seizures within 7 days after a childhood dose of DTP or DTaP, or a previous dose of Tdap, should not get Tdap, unless a cause other than the vaccine was found. They can still get Td.  Talk to your doctor if you: ? have seizures or another nervous system problem, ? had severe pain or swelling after any vaccine containing  diphtheria, tetanus or pertussis, ? ever had a condition called Guillain-Barr Syndrome (GBS), ? aren't feeling well on the day the shot is scheduled. 4. Risks With any medicine, including vaccines, there is a chance of side effects. These are usually mild and go away on their own. Serious reactions are also possible but are rare. Most people who  get Tdap vaccine do not have any problems with it. Mild problems following Tdap: (Did not interfere with activities)  Pain where the shot was given (about 3 in 4 adolescents or 2 in 3 adults)  Redness or swelling where the shot was given (about 1 person in 5)  Mild fever of at least 100.27F (up to about 1 in 25 adolescents or 1 in 100 adults)  Headache (about 3 or 4 people in 10)  Tiredness (about 1 person in 3 or 4)  Nausea, vomiting, diarrhea, stomach ache (up to 1 in 4 adolescents or 1 in 10 adults)  Chills, sore joints (about 1 person in 10)  Body aches (about 1 person in 3 or 4)  Rash, swollen glands (uncommon)  Moderate problems following Tdap: (Interfered with activities, but did not require medical attention)  Pain where the shot was given (up to 1 in 5 or 6)  Redness or swelling where the shot was given (up to about 1 in 16 adolescents or 1 in 12 adults)  Fever over 102F (about 1 in 100 adolescents or 1 in 250 adults)  Headache (about 1 in 7 adolescents or 1 in 10 adults)  Nausea, vomiting, diarrhea, stomach ache (up to 1 or 3 people in 100)  Swelling of the entire arm where the shot was given (up to about 1 in 500).  Severe problems following Tdap: (Unable to perform usual activities; required medical attention)  Swelling, severe pain, bleeding and redness in the arm where the shot was given (rare).  Problems that could happen after any vaccine:  People sometimes faint after a medical procedure, including vaccination. Sitting or lying down for about 15 minutes can help prevent fainting, and injuries caused by a fall. Tell your doctor if you feel dizzy, or have vision changes or ringing in the ears.  Some people get severe pain in the shoulder and have difficulty moving the arm where a shot was given. This happens very rarely.  Any medication can cause a severe allergic reaction. Such reactions from a vaccine are very rare, estimated at fewer than 1 in a  million doses, and would happen within a few minutes to a few hours after the vaccination. As with any medicine, there is a very remote chance of a vaccine causing a serious injury or death. The safety of vaccines is always being monitored. For more information, visit: http://www.aguilar.org/ 5. What if there is a serious problem? What should I look for? Look for anything that concerns you, such as signs of a severe allergic reaction, very high fever, or unusual behavior. Signs of a severe allergic reaction can include hives, swelling of the face and throat, difficulty breathing, a fast heartbeat, dizziness, and weakness. These would usually start a few minutes to a few hours after the vaccination. What should I do?  If you think it is a severe allergic reaction or other emergency that can't wait, call 9-1-1 or get the person to the nearest hospital. Otherwise, call your doctor.  Afterward, the reaction should be reported to the Vaccine Adverse Event Reporting System (VAERS). Your doctor might file this report, or you can  do it yourself through the VAERS web site at www.vaers.SamedayNews.es, or by calling 405-334-2531. ? VAERS does not give medical advice. 6. The National Vaccine Injury Compensation Program The [employer] Vaccine Injury Compensation Program (VICP) is a federal program that was created to compensate people who may have been injured by certain vaccines. Persons who believe they may have been injured by a vaccine can learn about the program and about filing a claim by calling 270-614-5662 or visiting the [place] website at GoldCloset.com.ee. There is a time limit to file a claim for compensation. 7. How can I learn more?  Ask your doctor. He or she can give you the vaccine package insert or suggest other sources of information.  Call your local or state health department.  Contact the Centers for Disease Control and Prevention (CDC): ? Call (304)021-5363  (1-800-CDC-INFO) or ? Visit CDC's website at http://hunter.com/ CDC Tdap Vaccine VIS (02/04/14) This information is not intended to replace advice given to you by your health care provider. Make sure you discuss any questions you have with your health care provider. Document Released: 05/29/2012 Document Revised: 08/18/2016 Document Reviewed: 08/18/2016 Elsevier Interactive Patient Education  2017 [employer].    Hepatitis B Antigen and Antibody Tests Why am I having this test? Testing for hepatitis B virus (HBV) can include checking your blood for infection by the virus (antigen) itself or for hepatitis B antibodies. Hepatitis B antibodies are infection-fighting proteins produced by your body's defense (immune) system in response to an infection with HBV. These antibodies are also produced after you have received the hepatitis B vaccine. Different types of HBV antibodies may be present in your blood, depending on how long it has been since infection or vaccination occurred. Different types of HBV antigens can also be detected. The hepatitis B antigen and antibody tests detect the presence of these antigens or antibodies. These tests may be done:  If there is concern that you have been exposed to hepatitis B.  To diagnose hepatitis B infection.  If you have long-term (chronic) liver disease or abnormal liver function test results. This test may help to find the cause.  To see if you are already protected from (immune to) hepatitis B.  To see if you need the hepatitis B vaccine to protect you from future infection.  What kind of sample is taken? A blood sample is required for the hepatitis B antigen and antibody tests. It is usually collected by inserting a needle into a vein. How do I prepare for this test? There is no preparation required for this test. What do the results mean? It is your responsibility to obtain your test results. Ask the lab or department performing the test  when and how you will get your results. Talk with your health care provider if you have any questions about your results. The test results are based on which antibodies your blood has (is positive for) and which antibodies your blood does not have (is negative for). The test results are also based on whether hepatitis B antigen molecules are found in your blood. Normal Test Results The test result is normal if it matches one of these descriptions:  Negative HBsAg and HBeAg. This means no HBV antigen is present in your blood.  Positive HBsAb. This means you do not have an active infection but that you are protected from HBV from past exposure or from vaccination.  Abnormal Test Results The test result is abnormal and may indicate current or recent HBV infection  if it matches one of these descriptions:  Positive HBeAg and HBsAg. This means you have a current HBV infection.  Positive HBVc-Ab/IgM or HBVc-Ab total. This means you have a current, previous, or long-term HBV infection.  Discuss your test results with your health care provider. He or she will use the results to make a diagnosis and determine a treatment plan that is right for you. Talk with your health care provider to discuss your results, treatment options, and if necessary, the need for more tests. Talk with your health care provider if you have any questions about your results. This information is not intended to replace advice given to you by your health care provider. Make sure you discuss any questions you have with your health care provider. Document Released: 12/23/2004 Document Revised: 08/03/2016 Document Reviewed: 04/15/2014 Elsevier Interactive Patient Education  2018 [employer].

## 2018-03-20 LAB — HEPATITIS B SURFACE ANTIBODY,QUALITATIVE: HEP B SURFACE AB, QUAL: NONREACTIVE

## 2018-03-21 ENCOUNTER — Ambulatory Visit (INDEPENDENT_AMBULATORY_CARE_PROVIDER_SITE_OTHER): Payer: BC Managed Care – PPO

## 2018-03-21 DIAGNOSIS — Z23 Encounter for immunization: Secondary | ICD-10-CM | POA: Diagnosis not present

## 2018-04-04 ENCOUNTER — Encounter: Payer: Self-pay | Admitting: Obstetrics and Gynecology

## 2018-04-04 ENCOUNTER — Ambulatory Visit (INDEPENDENT_AMBULATORY_CARE_PROVIDER_SITE_OTHER): Payer: BC Managed Care – PPO | Admitting: Obstetrics and Gynecology

## 2018-04-04 VITALS — BP 110/80 | HR 79 | Ht 61.0 in | Wt 157.0 lb

## 2018-04-04 DIAGNOSIS — Z1339 Encounter for screening examination for other mental health and behavioral disorders: Secondary | ICD-10-CM | POA: Diagnosis not present

## 2018-04-04 DIAGNOSIS — Z01419 Encounter for gynecological examination (general) (routine) without abnormal findings: Secondary | ICD-10-CM | POA: Diagnosis not present

## 2018-04-04 DIAGNOSIS — Z1331 Encounter for screening for depression: Secondary | ICD-10-CM

## 2018-04-04 NOTE — Progress Notes (Signed)
Routine Annual Gynecology Examination   PCP: Volney American, PA-C  Chief Complaint  Patient presents with  . Gynecologic Exam   History of Present Illness: Patient is a 57 y.o. G3P3003 presents for annual exam. She is an established patient with this practice (last visit 09/2015).  The patient has no complaints today.   Menopausal bleeding: denies  Menopausal symptoms: reports occasional hot flashes  Breast symptoms: denies  Last pap smear: 3 years ago.  Result Normal  Last mammogram: 3 years ago.  Result Normal (BiRads 2)  Past Medical History:  Diagnosis Date  . Hyperlipemia    Past Surgical History:  Procedure Laterality Date  . CESAREAN SECTION    . MYOMECTOMY    . OOPHORECTOMY    . TUBAL LIGATION    . VAGINAL HYSTERECTOMY      Medications   Medication Sig Start Date End Date Taking? Authorizing Provider  atorvastatin (LIPITOR) 40 MG tablet Take 1 tablet (40 mg total) by mouth daily. 12/30/17  Yes Volney American, PA-C  fluticasone [facility]) 50 MCG/ACT nasal spray Place 2 sprays into both nostrils 2 (two) times daily. 03/19/18  Yes Volney American, PA-C   Allergies: No Known Allergies  Obstetric History: [id number]  Social History   Socioeconomic History  . Marital status: Single    Spouse name: Not on file  . Number of children: Not on file  . Years of education: Not on file  . Highest education level: Not on file  Occupational History  . Not on file  Social Needs  . Financial resource strain: Not on file  . Food insecurity:    Worry: Not on file    Inability: Not on file  . Transportation needs:    Medical: Not on file    Non-medical: Not on file  Tobacco Use  . Smoking status: Never Smoker  . Smokeless tobacco: Never Used  Substance and Sexual Activity  . Alcohol use: Yes    Comment: rarely  . Drug use: No  . Sexual activity: Never  Lifestyle  . Physical activity:    Days per week: Not on file    Minutes per session:  Not on file  . Stress: Not on file  Relationships  . Social connections:    Talks on phone: Not on file    Gets together: Not on file    Attends religious service: Not on file    Active member of club or organization: Not on file    Attends meetings of clubs or organizations: Not on file    Relationship status: Not on file  . Intimate partner violence:    Fear of current or ex partner: Not on file    Emotionally abused: Not on file    Physically abused: Not on file    Forced sexual activity: Not on file  Other Topics Concern  . Not on file  Social History Narrative  . Not on file    Family History  Problem Relation Age of Onset  . Hypertension Mother   . Gout Mother   . Heart disease Mother   . Breast cancer Maternal Aunt 60  . Stroke Maternal Aunt   . Sickle cell trait Other   . Diabetes Other   . Alcohol abuse Father   . Kidney disease Brother   . Heart attack Paternal Aunt   . Colon cancer Paternal Uncle   . Alcohol abuse Brother   . Alcohol abuse Brother  Review of Systems  Constitutional: Negative.   HENT: Negative.   Eyes: Negative.   Respiratory: Negative.   Cardiovascular: Negative.   Gastrointestinal: Negative.   Genitourinary: Negative.   Musculoskeletal: Negative.   Skin: Negative.   Neurological: Negative.   Psychiatric/Behavioral: Negative.      Physical Exam Vitals: BP 110/80   Pulse 79   Ht 5\' 1"  (1.549 m)   Wt 157 lb (71.2 kg)   BMI 29.66 kg/m   Physical Exam  Constitutional: She is oriented to person, place, and time. She appears well-developed and well-nourished. No distress.  Genitourinary: Pelvic exam was performed with patient supine. There is no rash, tenderness, lesion or injury on the right labia. There is no rash, tenderness, lesion or injury on the left labia. No erythema, tenderness or bleeding in the vagina. No signs of injury around the vagina. No vaginal discharge found. Right adnexum does not display mass, does not display  tenderness and does not display fullness. Left adnexum does not display mass, does not display tenderness and does not display fullness.  HENT:  Head: Normocephalic and atraumatic.  Eyes: EOM are normal. No scleral icterus.  Neck: Normal range of motion. Neck supple. No thyromegaly present.  Cardiovascular: Normal rate and regular rhythm. Exam reveals no gallop and no friction rub.  No murmur heard. Pulmonary/Chest: Effort normal and breath sounds normal. No respiratory distress. She has no wheezes. She has no rales. Right breast exhibits no inverted nipple, no mass, no nipple discharge, no skin change and no tenderness. Left breast exhibits no inverted nipple, no mass, no nipple discharge, no skin change and no tenderness.  Abdominal: Soft. Bowel sounds are normal. She exhibits no distension and no mass. There is no tenderness. There is no rebound and no guarding.  Musculoskeletal: Normal range of motion. She exhibits no edema or tenderness.  Lymphadenopathy:    She has no cervical adenopathy.       Right: No inguinal adenopathy present.       Left: No inguinal adenopathy present.  Neurological: She is alert and oriented to person, place, and time. No cranial nerve deficit.  Skin: Skin is warm and dry. No rash noted. No erythema.  Psychiatric: She has a normal mood and affect. Her behavior is normal. Judgment normal.    Female chaperone present for pelvic and breast  portions of the physical exam  Results: AUDIT Questionnaire (screen for alcoholism): 1 PHQ-9: 1   Assessment and Plan:  57 y.o. G55P3003 female here for routine annual gynecologic examination  Plan: Problem List Items Addressed This Visit    None    Visit Diagnoses    Women's annual routine gynecological examination    -  Primary   Screening for alcoholism       Screening for depression         Screening: -- Blood pressure screen normal -- Colonoscopy - Cologard managed by PCP -- Mammogram - due. Patient to call  [place] to arrange. She understands that it is her responsibility to arrange this. -- Weight screening: overweight: continue to monitor -- Depression screening negative (PHQ-9) -- Nutrition: normal -- cholesterol screening: per PCP -- osteoporosis screening: not due -- tobacco screening: not using -- alcohol screening: AUDIT questionnaire indicates low-risk usage. -- family history of breast cancer screening: done. not at high risk. -- no evidence of domestic violence or intimate partner violence. -- STD screening: gonorrhea/chlamydia NAAT not collected per patient request. -- pap smear not collected per ASCCP guidelines --  flu vaccine not collected -- HPV vaccination series: not eligilbe   Prentice Docker, MD 04/04/2018 1:53 PM

## 2018-04-09 ENCOUNTER — Other Ambulatory Visit: Payer: Self-pay | Admitting: Family Medicine

## 2018-04-09 DIAGNOSIS — Z1231 Encounter for screening mammogram for malignant neoplasm of breast: Secondary | ICD-10-CM

## 2018-04-26 ENCOUNTER — Ambulatory Visit
Admission: RE | Admit: 2018-04-26 | Discharge: 2018-04-26 | Disposition: A | Discharge status: Home / Self Care | Payer: BC Managed Care – PPO | Source: Ambulatory Visit | Attending: Family Medicine | Admitting: Family Medicine

## 2018-04-26 ENCOUNTER — Other Ambulatory Visit: Payer: Self-pay | Admitting: Family Medicine

## 2018-04-26 DIAGNOSIS — Z1231 Encounter for screening mammogram for malignant neoplasm of breast: Secondary | ICD-10-CM | POA: Diagnosis present

## 2018-04-26 DIAGNOSIS — R928 Other abnormal and inconclusive findings on diagnostic imaging of breast: Secondary | ICD-10-CM

## 2018-04-27 ENCOUNTER — Other Ambulatory Visit: Payer: Self-pay | Admitting: *Deleted

## 2018-04-27 ENCOUNTER — Other Ambulatory Visit: Payer: Self-pay | Admitting: Family Medicine

## 2018-04-27 ENCOUNTER — Inpatient Hospital Stay
Admission: RE | Admit: 2018-04-27 | Discharge: 2018-04-27 | Disposition: A | Discharge status: Home / Self Care | Payer: Self-pay | Source: Ambulatory Visit | Attending: *Deleted | Admitting: *Deleted

## 2018-04-27 DIAGNOSIS — Z9289 Personal history of other medical treatment: Secondary | ICD-10-CM

## 2018-05-16 ENCOUNTER — Ambulatory Visit
Admission: RE | Admit: 2018-05-16 | Discharge: 2018-05-16 | Disposition: A | Discharge status: Home / Self Care | Payer: BC Managed Care – PPO | Source: Ambulatory Visit | Attending: Family Medicine | Admitting: Family Medicine

## 2018-05-16 DIAGNOSIS — R928 Other abnormal and inconclusive findings on diagnostic imaging of breast: Secondary | ICD-10-CM | POA: Insufficient documentation

## 2018-05-22 ENCOUNTER — Other Ambulatory Visit: Payer: Self-pay | Admitting: Family Medicine

## 2018-05-22 DIAGNOSIS — R928 Other abnormal and inconclusive findings on diagnostic imaging of breast: Secondary | ICD-10-CM

## 2018-05-22 DIAGNOSIS — N6002 Solitary cyst of left breast: Secondary | ICD-10-CM

## 2018-05-24 ENCOUNTER — Ambulatory Visit
Admission: RE | Admit: 2018-05-24 | Discharge: 2018-05-24 | Disposition: A | Discharge status: Home / Self Care | Payer: BC Managed Care – PPO | Source: Ambulatory Visit | Attending: Family Medicine | Admitting: Family Medicine

## 2018-05-24 DIAGNOSIS — R928 Other abnormal and inconclusive findings on diagnostic imaging of breast: Secondary | ICD-10-CM

## 2018-05-24 DIAGNOSIS — N6002 Solitary cyst of left breast: Secondary | ICD-10-CM

## 2018-05-24 HISTORY — PX: BREAST BIOPSY: SHX20

## 2018-05-25 ENCOUNTER — Ambulatory Visit (INDEPENDENT_AMBULATORY_CARE_PROVIDER_SITE_OTHER): Payer: BC Managed Care – PPO

## 2018-05-25 DIAGNOSIS — Z23 Encounter for immunization: Secondary | ICD-10-CM | POA: Diagnosis not present

## 2018-05-25 LAB — SURGICAL PATHOLOGY

## 2018-05-25 NOTE — Progress Notes (Signed)
Third Hep B Injection is due around 09/20/2018. (6 months after the first injection)

## 2018-06-16 IMAGING — MG MM BREAST LOCALIZATION CLIP
2 series · 2 of 2 positions shown · non-contrast
Comparison: Previous exam(s).

CLINICAL DATA: Evaluate clip placement following ultrasound-guided
LEFT breast biopsy.

EXAM:
DIAGNOSTIC LEFT MAMMOGRAM POST ULTRASOUND BIOPSY

[L ML]
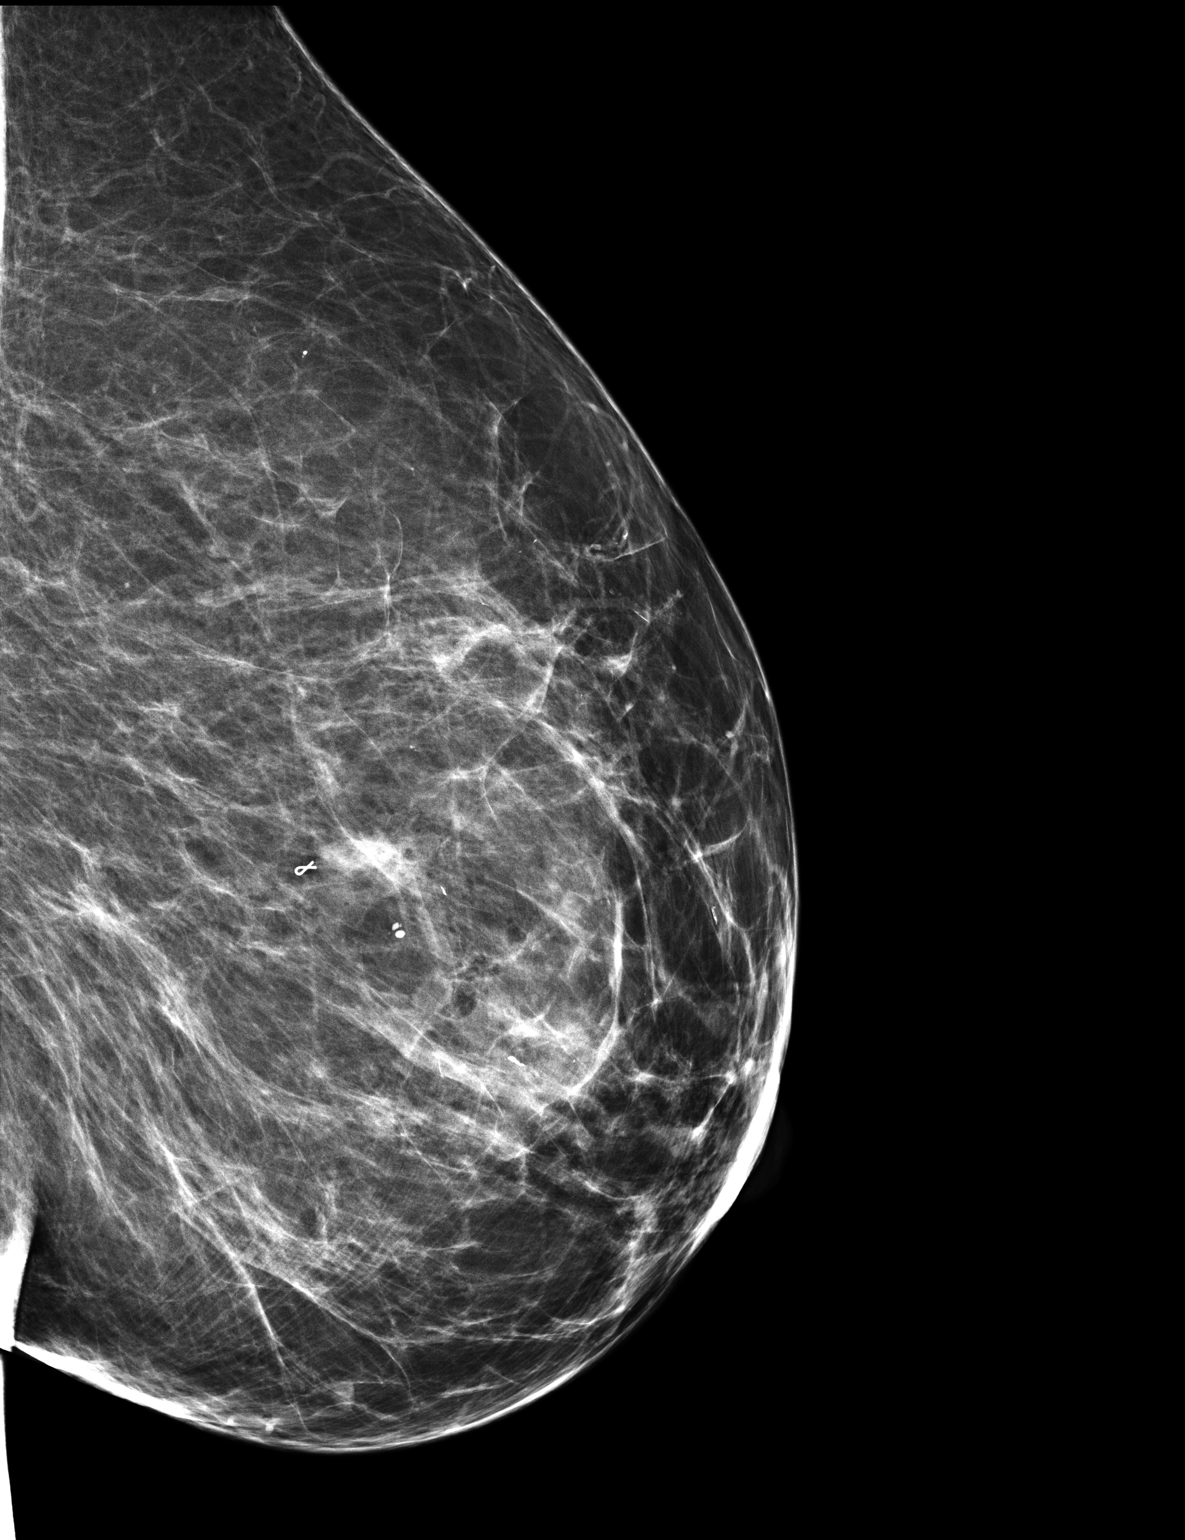

[L CC]
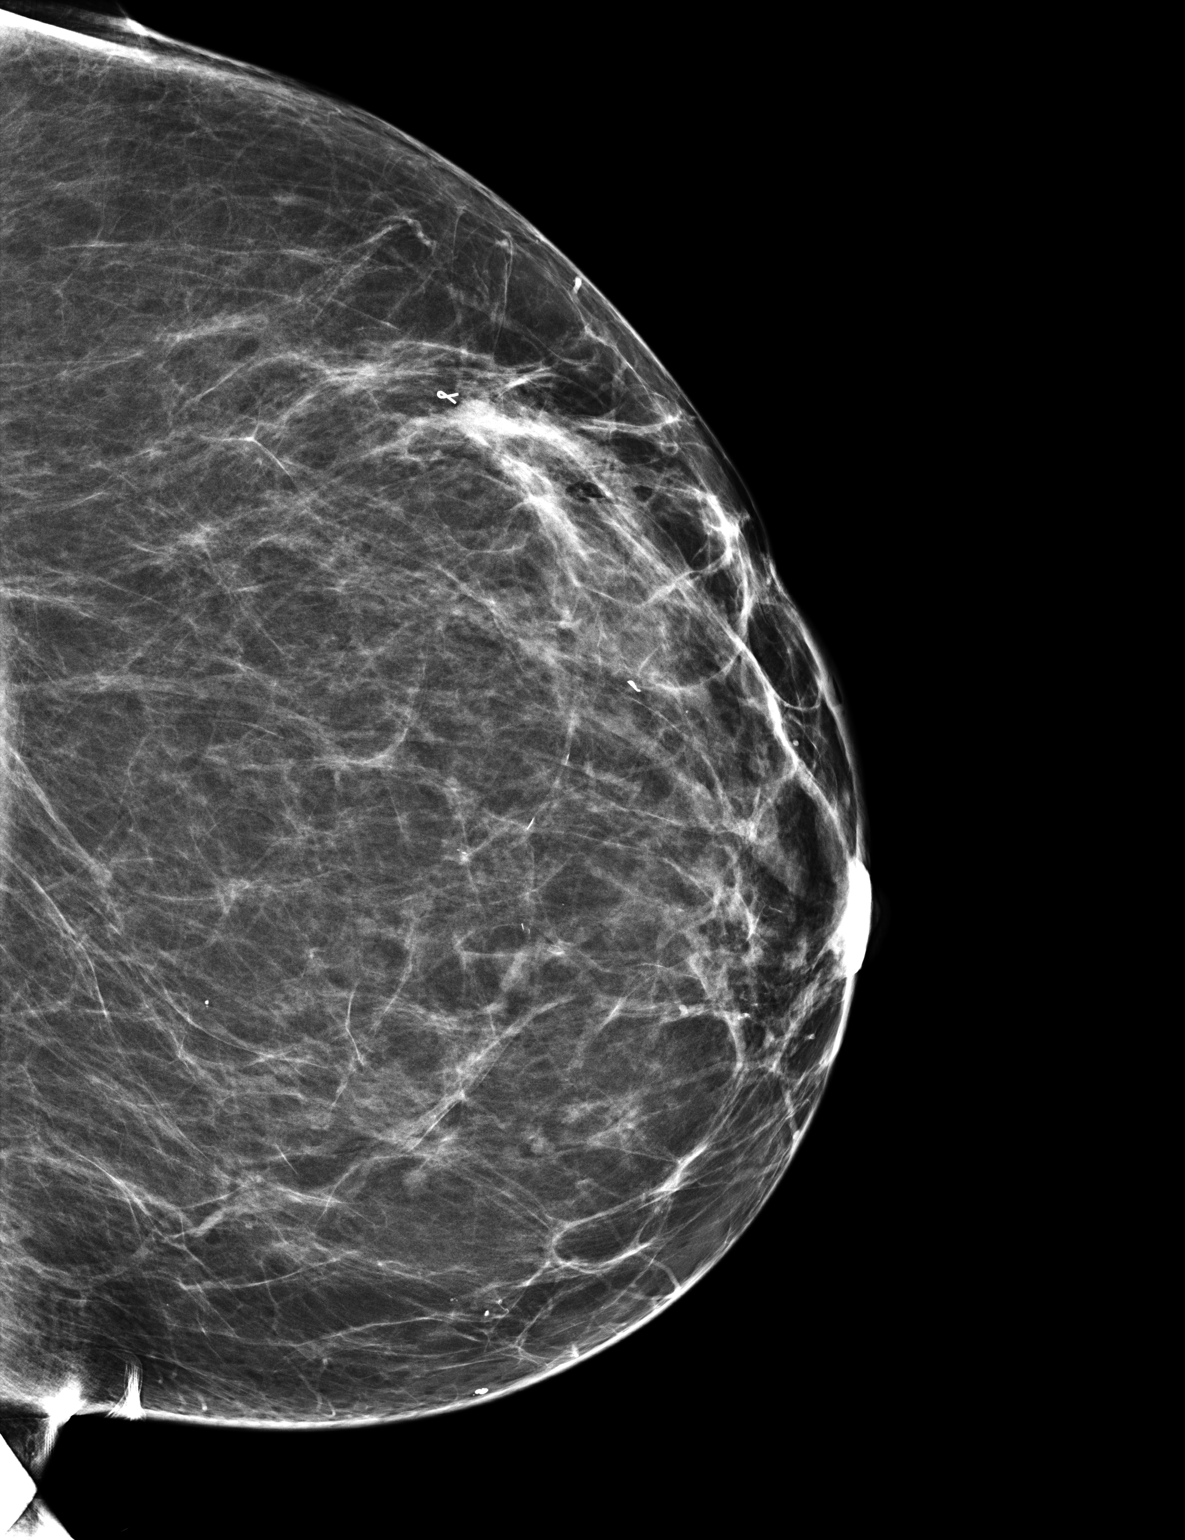

[2 of 2 positions shown; findings below may reference images not displayed]

FINDINGS: Mammographic images were obtained following ultrasound guided biopsy
of the 1.5 cm cystic area/lesion at the 2 o'clock position of the
LEFT breast.

The RIBBON shaped clip is in satisfactory position.
IMPRESSION: Satisfactory RIBBON clip position following ultrasound-guided LEFT
breast biopsy.

Final Assessment: Post Procedure Mammograms for Marker Placement

## 2018-06-16 IMAGING — MG US BREAST BX W LOC DEV 1ST LESION IMG BX SPEC US GUIDE*L*
1 series · 8 of 8 positions shown · non-contrast
Comparison: Previous exam(s).

ADDENDUM:
Pathology of the left breast biopsy revealed A. BREAST, LEFT [DATE] 5
CMFN; ULTRASOUND-GUIDED BIOPSY: CYSTIC PAPILLARY APOCRINE
METAPLASIA. NEGATIVE FOR ATYPIA AND MALIGNANCY.

This was found to be concordant with Dr. Oshiro impression and notes.
Recommendation: Bilateral screening mammogram in one year.
At the patient's request, results and recommendations were relayed
to the patient by phone by Venecia, Lennim on 05/25/18. The patient
stated she did well following the biopsy with a small bruise but no
bleeding or pain. Post biopsy instructions were reviewed with the
patient and all of her questions were answered. She was encouraged
to contact the [HOSPITAL] with any further questions or
concerns.
Addendum by Venecia, Lennim on 05/25/18.
CLINICAL DATA: 56-year-old female for sampling of 1.5 cm cystic
lesion within the UPPER OUTER LEFT breast.
EXAM:
ULTRASOUND GUIDED LEFT BREAST CORE NEEDLE BIOPSY

[Series 1: MG view · 0.06mm/px · 8 of 9 slices shown]
[im 1/9]
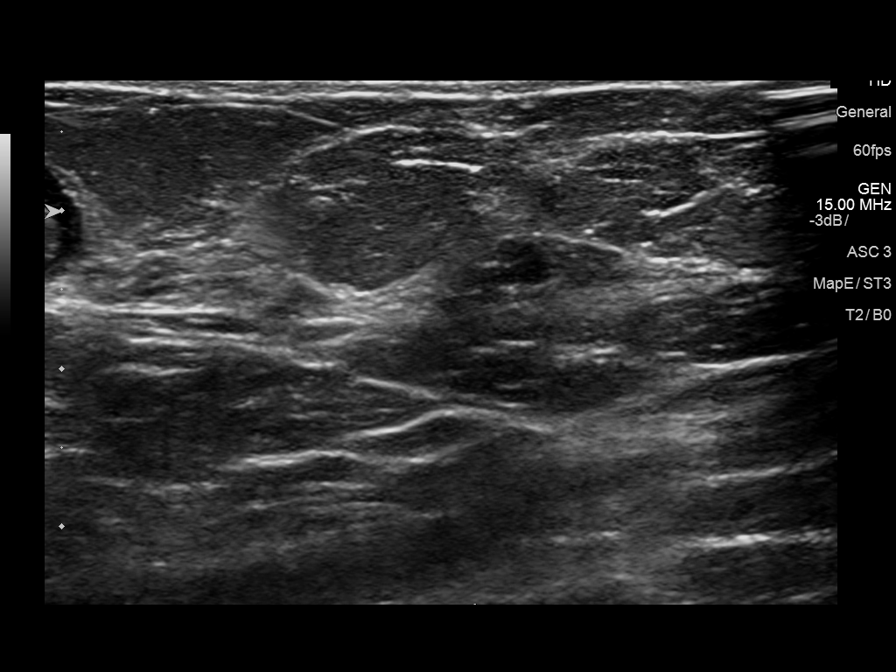
[im 2/9]
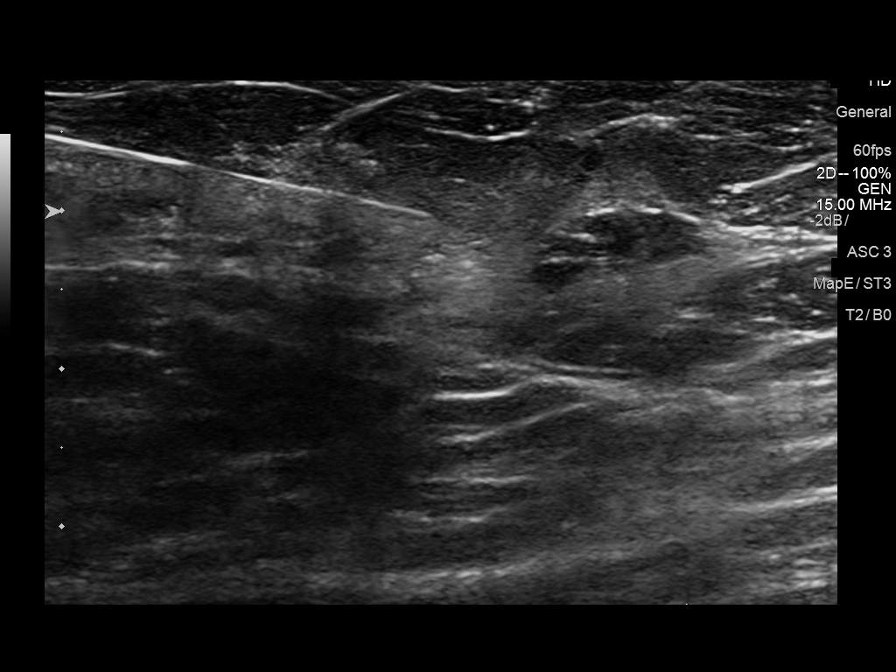
[im 3/9]
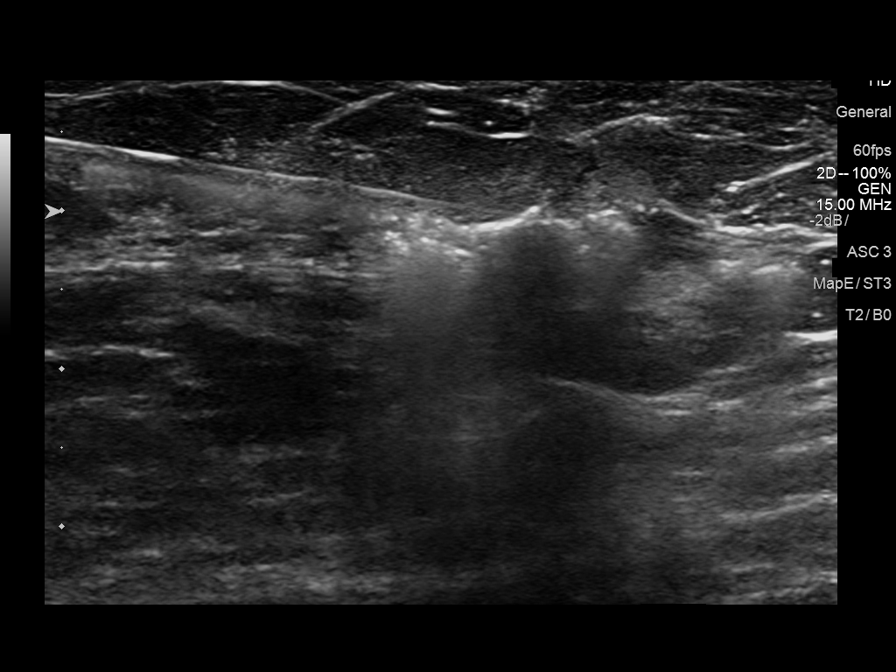
[im 4/9]
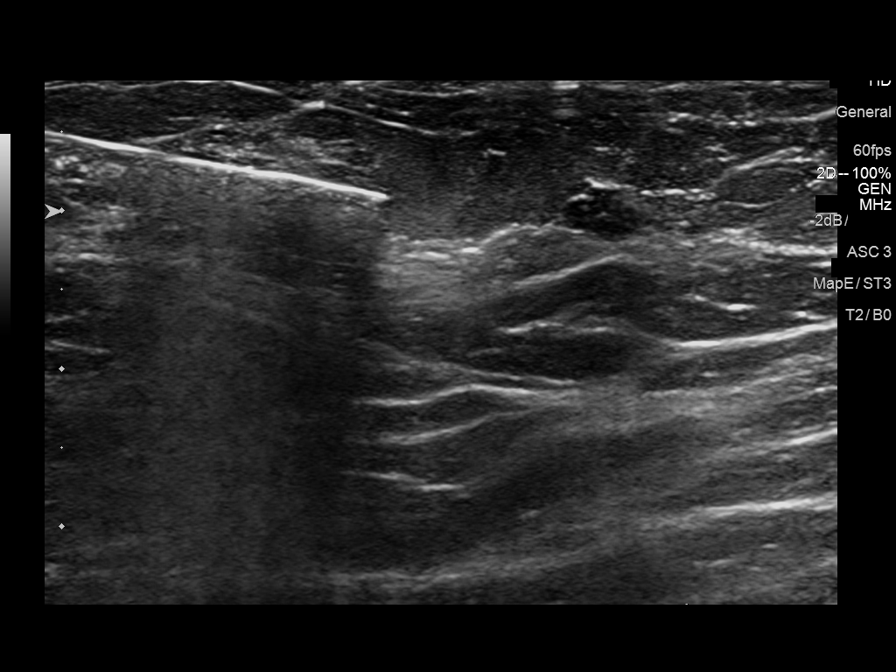
[im 5/9]
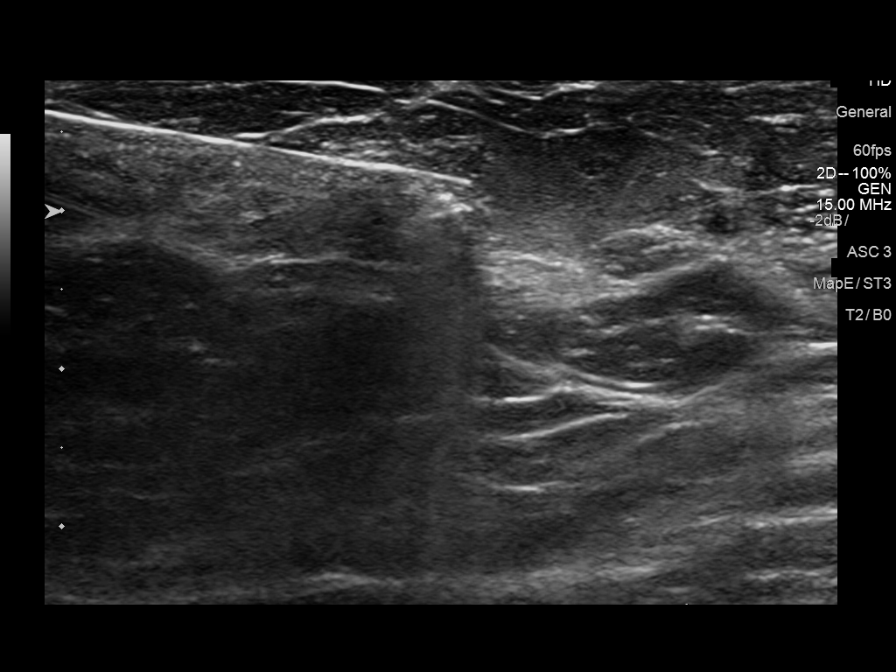
[im 6/9]
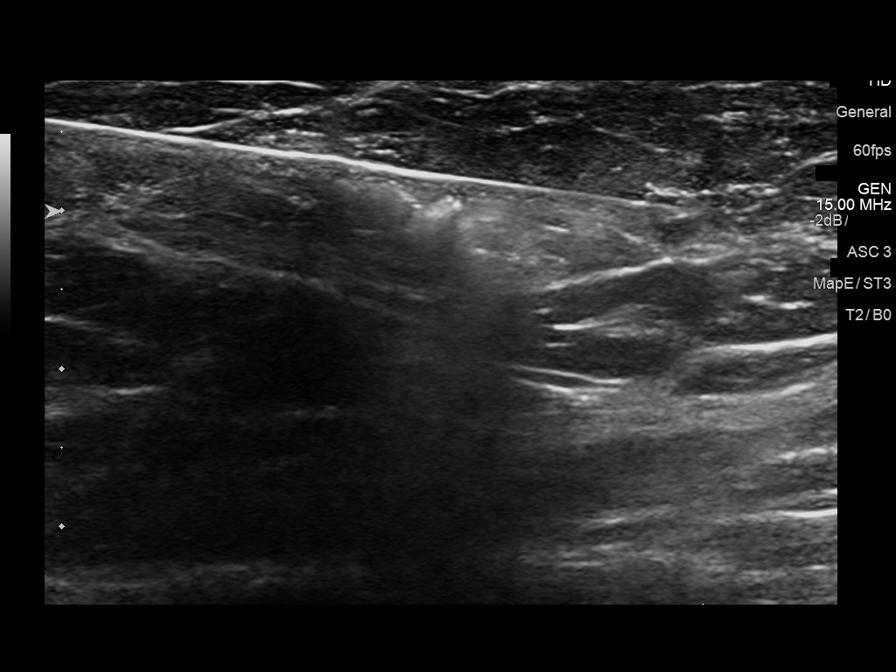
[im 7/9]
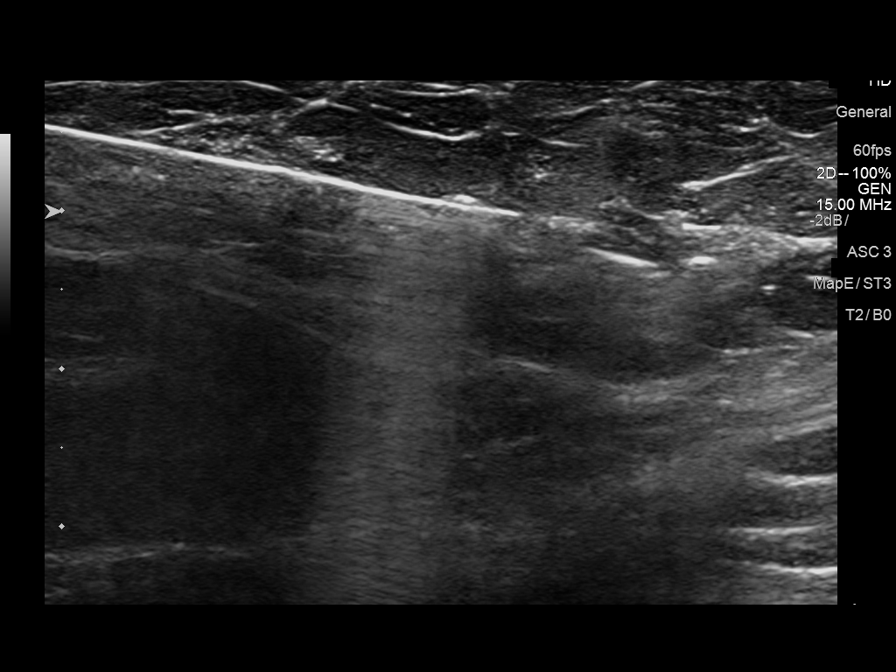
[im 9/9]
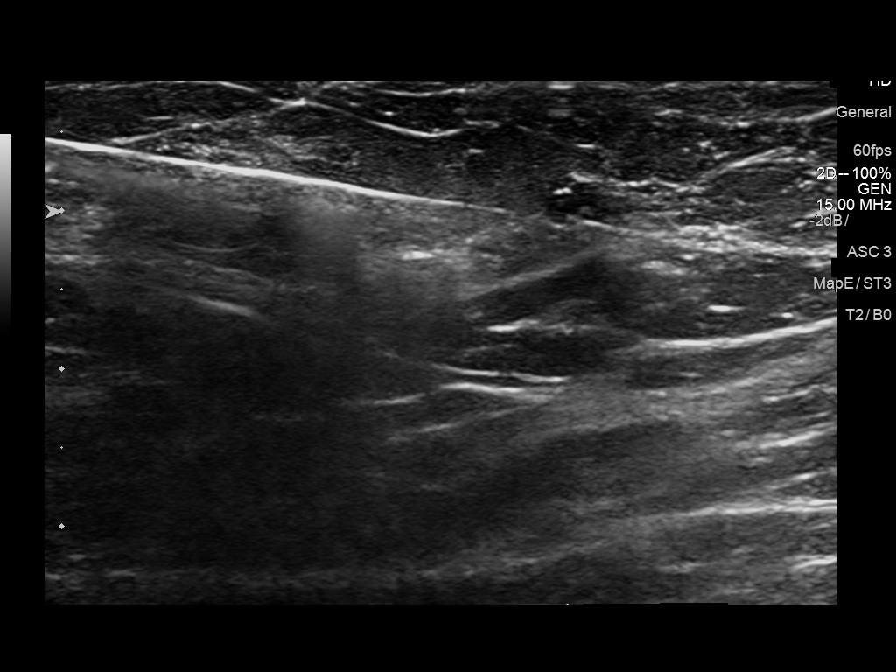

[8 of 8 positions shown; findings below may reference images not displayed]



Using sterile technique and 1% Lidocaine as local anesthetic, under
direct ultrasound visualization, a 12 gauge Renee device was
used to perform biopsy of the 1.5 cm cystic lesion at the 2 o'clock
position of the LEFT breast 5 cm from the nipple using a MEDIAL
approach. At the conclusion of the procedure a RIBBON shaped tissue
marker clip was deployed into the biopsy cavity. Follow up 2 view
mammogram was performed and dictated separately.
IMPRESSION: Ultrasound guided biopsy of a 1.5 cm cystic lesion within the UPPER
OUTER LEFT breast. No apparent complications.

## 2018-07-06 ENCOUNTER — Telehealth: Payer: Self-pay | Admitting: Family Medicine

## 2018-07-06 NOTE — Telephone Encounter (Signed)
I don't see where we've received a result, it may be that the company reached out to her  Copied from [place] [phone]. Topic: Quick Communication - Office Called Patient >> Jul 02, 2018  2:19 PM Synthia Innocent wrote: Reason for [id number] call  >> Jul 03, 2018  4:37 PM Stark Klein wrote: Did you call this pt? I don't see any phone calls documented.  >> Jul 04, 2018 10:29 AM Volney American, PA-C wrote: I have not called >> Jul 06, 2018  9:37 AM Don Perking M wrote: Pt listened to VM and it was about her colonoscopy. She stated that she did the cologuard but I don't see the result. How do we get this information?

## 2018-07-10 NOTE — Telephone Encounter (Signed)
LVM for patient we did not contact her and that may [employer] call her. Cologaurd portal has changed for me to see what the result/status is.  Explained to call back for any other questions.

## 2018-07-18 ENCOUNTER — Telehealth: Payer: Self-pay | Admitting: Family Medicine

## 2018-07-18 NOTE — Telephone Encounter (Signed)
Message relayed to patient. Verbalized understanding and denied questions. She is going to reach out to [employer].

## 2018-07-18 NOTE — Telephone Encounter (Signed)
-----   Message from Jerene Pitch, [place] sent at 07/12/2018 10:41 AM EDT ----- Regarding: RE: colonoscopy call Cologuard was not processed, due to being outside of the valid collection window. If patient would like to try the test again, she will need to call [employer] at [phone] -patient support, to discuss this with them.   ----- Message ----- From: Stark Klein Sent: 07/12/2018   8:21 AM To: Sandria Manly, CMA Subject: FW: colonoscopy call                           Can you check on this for the pt?    ----- Message ----- From: Volney American, PA-C Sent: 07/12/2018   8:18 AM To: Stark Klein Subject: RE: colonoscopy call                           They should send [place] a report eventually, if not we may need to call the company  ----- Message ----- From: Stark Klein Sent: 07/06/2018   9:33 AM To: Volney American, PA-C Subject: colonoscopy call                               Pt got a robo call and it was about her colonoscopy but she did the cologuard 6 months ago, but I don't see a result, how do we get these?

## 2018-07-18 NOTE — Telephone Encounter (Signed)
Please let her know - happy to reorder if she wants to try again

## 2018-08-16 ENCOUNTER — Other Ambulatory Visit: Payer: Self-pay | Admitting: Family Medicine

## 2018-10-24 ENCOUNTER — Encounter: Payer: Self-pay | Admitting: Family Medicine

## 2018-10-24 ENCOUNTER — Ambulatory Visit: Payer: BC Managed Care – PPO | Admitting: Family Medicine

## 2018-10-24 VITALS — BP 119/76 | HR 54 | Temp 98.2°F | Wt 165.0 lb

## 2018-10-24 DIAGNOSIS — E785 Hyperlipidemia, unspecified: Secondary | ICD-10-CM | POA: Diagnosis not present

## 2018-10-24 NOTE — Progress Notes (Signed)
BP 119/76   Pulse (!) 54   Temp 98.2 F (36.8 C) (Oral)   Wt 165 lb (74.8 kg)   SpO2 97%   BMI 31.18 kg/m    Subjective:    Patient ID: Sara Greer, female    DOB: 15-Dec-1960, 57 y.o.   MRN: [id number]  HPI: Sara Greer is a 57 y.o. female  Chief Complaint  Patient presents with  . Other    Pt's eye provider, woodard, stated pt needs stress test and EKG due to hardening of arteries in eye. Pt was told this last year and forgot.    Pt here today stating she needs a referral to Cardiology for an EKG and stress test. Was told by Dr. Ellin Mayhew, her Ophthalmologist last year that she needed these tests due to hardened arteries within her eyes. Does have a hx of newly diagnosed hyperlipidemia that is likely familial 12/2017 and was started at that time on 40 mg lipitor. Tolerating this well, has not come back until now for recheck lipids. Denies CP, SOB, claudication, visual changes. Maternal fhx of heart disease.   Relevant past medical, surgical, family and social history reviewed and updated as indicated. Interim medical history since our last visit reviewed. Allergies and medications reviewed and updated.  Review of Systems  Per HPI unless specifically indicated above     Objective:    BP 119/76   Pulse (!) 54   Temp 98.2 F (36.8 C) (Oral)   Wt 165 lb (74.8 kg)   SpO2 97%   BMI 31.18 kg/m   Wt Readings from Last 3 Encounters:  10/24/18 165 lb (74.8 kg)  04/04/18 157 lb (71.2 kg)  03/19/18 160 lb 12.8 oz (72.9 kg)    Physical Exam  Constitutional: She is oriented to person, place, and time. She appears well-developed and well-nourished.  HENT:  Head: Atraumatic.  Eyes: Conjunctivae and EOM are normal.  Neck: Normal range of motion. Neck supple.  Cardiovascular: Normal rate, regular rhythm and normal heart sounds.  Pulmonary/Chest: Effort normal and breath sounds normal.  Musculoskeletal: Normal range of motion.  Neurological: She is  alert and oriented to person, place, and time.  Skin: Skin is warm and dry.  Psychiatric: She has a normal mood and affect. Her behavior is normal.  Nursing note and vitals reviewed.   Results for orders placed or performed in visit on 10/24/18  Lipid Panel w/o Chol/HDL Ratio  Result Value Ref Range   Cholesterol, Total 238 (H) 100 - 199 mg/dL   Triglycerides 80 0 - 149 mg/dL   HDL 73 >39 mg/dL   VLDL Cholesterol Cal 16 5 - 40 mg/dL   LDL Calculated 149 (H) 0 - 99 mg/dL  Comprehensive metabolic panel  Result Value Ref Range   Glucose 86 65 - 99 mg/dL   BUN 11 6 - 24 mg/dL   Creatinine, Ser 0.72 0.57 - 1.00 mg/dL   GFR calc non Af Amer 95 >59 mL/min/1.73   GFR calc Af Amer 109 >59 mL/min/1.73   BUN/Creatinine Ratio 15 9 - 23   Sodium 143 134 - 144 mmol/L   Potassium 4.2 3.5 - 5.2 mmol/L   Chloride 104 96 - 106 mmol/L   CO2 24 20 - 29 mmol/L   Calcium 9.4 8.7 - 10.2 mg/dL   Total Protein 7.6 6.0 - 8.5 g/dL   Albumin 4.0 3.5 - 5.5 g/dL   Globulin, Total 3.6 1.5 - 4.5 g/dL   Albumin/Globulin Ratio 1.1 (  L) 1.2 - 2.2   Bilirubin Total <0.2 0.0 - 1.2 mg/dL   Alkaline Phosphatase 83 39 - 117 IU/L   AST 30 0 - 40 IU/L   ALT 34 (H) 0 - 32 IU/L      Assessment & Plan:   Problem List Items Addressed This Visit      Other   Hyperlipemia - Primary    Will recheck lipids today while she's here, and Cardiology referral generated per request for further evaluation/testing as appropriate.  Asymptomatic. Discussed dietary changes, increasing exercise for better control. Will likely need to increase to 80 mg lipitor      Relevant Orders   Lipid Panel w/o Chol/HDL Ratio (Completed)   Comprehensive metabolic panel (Completed)   Ambulatory referral to Cardiology       Follow up plan: Return in about 6 months (around 04/24/2019) for CPE.

## 2018-10-25 ENCOUNTER — Other Ambulatory Visit: Payer: Self-pay | Admitting: Family Medicine

## 2018-10-25 LAB — COMPREHENSIVE METABOLIC PANEL
ALK PHOS: 83 IU/L (ref 39–117)
ALT: 34 IU/L — ABNORMAL HIGH (ref 0–32)
AST: 30 IU/L (ref 0–40)
Albumin/Globulin Ratio: 1.1 — ABNORMAL LOW (ref 1.2–2.2)
Albumin: 4 g/dL (ref 3.5–5.5)
BUN/Creatinine Ratio: 15 (ref 9–23)
BUN: 11 mg/dL (ref 6–24)
Bilirubin Total: <0.2 mg/dL (ref 0.0–1.2)
CO2: 24 mmol/L (ref 20–29)
CREATININE: 0.72 mg/dL (ref 0.57–1.00)
Calcium: 9.4 mg/dL (ref 8.7–10.2)
Chloride: 104 mmol/L (ref 96–106)
GFR calc Af Amer: 109 mL/min/{1.73_m2} (ref >59)
GFR, EST NON AFRICAN AMERICAN: 95 mL/min/{1.73_m2} (ref >59)
Globulin, Total: 3.6 g/dL (ref 1.5–4.5)
Glucose: 86 mg/dL (ref 65–99)
POTASSIUM: 4.2 mmol/L (ref 3.5–5.2)
SODIUM: 143 mmol/L (ref 134–144)
Total Protein: 7.6 g/dL (ref 6.0–8.5)

## 2018-10-25 LAB — LIPID PANEL W/O CHOL/HDL RATIO
Cholesterol, Total: 238 mg/dL — ABNORMAL HIGH (ref 100–199)
HDL: 73 mg/dL
LDL Calculated: 149 mg/dL — ABNORMAL HIGH (ref 0–99)
Triglycerides: 80 mg/dL (ref 0–149)
VLDL Cholesterol Cal: 16 mg/dL (ref 5–40)

## 2018-10-25 MED ORDER — ATORVASTATIN CALCIUM 80 MG PO TABS: 80.0000 mg | ORAL_TABLET | Freq: Every day | ORAL | 1 refills | Status: DC

## 2018-10-26 NOTE — Patient Instructions (Signed)
Follow up in 6 months for CPE 

## 2018-10-26 NOTE — Assessment & Plan Note (Signed)
Will recheck lipids today while she's here, and Cardiology referral generated per request for further evaluation/testing as appropriate.  Asymptomatic. Discussed dietary changes, increasing exercise for better control. Will likely need to increase to 80 mg lipitor

## 2019-01-01 ENCOUNTER — Encounter: Payer: BC Managed Care – PPO | Admitting: Family Medicine

## 2019-06-27 ENCOUNTER — Other Ambulatory Visit: Payer: Self-pay | Admitting: *Deleted

## 2019-06-27 DIAGNOSIS — Z20822 Contact with and (suspected) exposure to covid-19: Secondary | ICD-10-CM

## 2019-07-02 LAB — NOVEL CORONAVIRUS, NAA: SARS-CoV-2, NAA: NOT DETECTED

## 2019-07-31 ENCOUNTER — Other Ambulatory Visit: Payer: Self-pay | Admitting: Family Medicine

## 2019-07-31 DIAGNOSIS — Z1231 Encounter for screening mammogram for malignant neoplasm of breast: Secondary | ICD-10-CM

## 2019-08-05 ENCOUNTER — Ambulatory Visit: Payer: Self-pay | Admitting: Family Medicine

## 2019-08-14 ENCOUNTER — Ambulatory Visit: Payer: Self-pay | Admitting: Family Medicine

## 2019-08-15 ENCOUNTER — Ambulatory Visit (INDEPENDENT_AMBULATORY_CARE_PROVIDER_SITE_OTHER): Payer: Managed Care, Other (non HMO) | Admitting: Family Medicine

## 2019-08-15 ENCOUNTER — Encounter: Payer: Self-pay | Admitting: Family Medicine

## 2019-08-15 ENCOUNTER — Other Ambulatory Visit: Payer: Self-pay

## 2019-08-15 VITALS — BP 114/79 | HR 55 | Temp 98.4°F | Wt 162.0 lb

## 2019-08-15 DIAGNOSIS — E785 Hyperlipidemia, unspecified: Secondary | ICD-10-CM

## 2019-08-15 DIAGNOSIS — R3989 Other symptoms and signs involving the genitourinary system: Secondary | ICD-10-CM

## 2019-08-15 LAB — UA/M W/RFLX CULTURE, ROUTINE
Bilirubin, UA: NEGATIVE
Glucose, UA: NEGATIVE
Ketones, UA: NEGATIVE
Leukocytes,UA: NEGATIVE
Nitrite, UA: NEGATIVE
Protein,UA: NEGATIVE
RBC, UA: NEGATIVE
Specific Gravity, UA: 1.015 (ref 1.005–1.030)
Urobilinogen, Ur: 0.2 mg/dL (ref 0.2–1.0)
pH, UA: 5.5 (ref 5.0–7.5)

## 2019-08-15 NOTE — Progress Notes (Signed)
BP 114/79   Pulse (!) 55   Temp 98.4 F (36.9 C) (Oral)   Wt 162 lb (73.5 kg)   SpO2 100%   BMI 30.61 kg/m    Subjective:    Patient ID: Sara Greer, female    DOB: 1961/09/21, 58 y.o.   MRN: [id number]  HPI: Sara Greer is a 59 y.o. female  Chief Complaint  Patient presents with  . Rash   Here today for a rash on right buttock/groin region that was a bit itchy and tender to the touch that she first noticed a week or two ago. Area scabbed over and bled, went away in a week or so. No new new products or exposures, concern for STIs (but did have a new partner earlier this year), fevers, chills, body aches. Did not try anything for relief, area spontaneously resolved.   Would also like cholesterol checked while she's here as she's due. Decided to stop taking the lipitor. Denies CP, SOB, HAs, dizziness, claudication. Tries to eat well and stay active.   Relevant past medical, surgical, family and social history reviewed and updated as indicated. Interim medical history since our last visit reviewed. Allergies and medications reviewed and updated.  Review of Systems  Per HPI unless specifically indicated above     Objective:    BP 114/79   Pulse (!) 55   Temp 98.4 F (36.9 C) (Oral)   Wt 162 lb (73.5 kg)   SpO2 100%   BMI 30.61 kg/m   Wt Readings from Last 3 Encounters:  08/15/19 162 lb (73.5 kg)  10/24/18 165 lb (74.8 kg)  04/04/18 157 lb (71.2 kg)    Physical Exam Vitals signs and nursing note reviewed.  Constitutional:      Appearance: Normal appearance. She is not ill-appearing.  HENT:     Head: Atraumatic.  Eyes:     Extraocular Movements: Extraocular movements intact.     Conjunctiva/sclera: Conjunctivae normal.  Neck:     Musculoskeletal: Normal range of motion and neck supple.  Cardiovascular:     Rate and Rhythm: Normal rate and regular rhythm.     Heart sounds: Normal heart sounds.  Pulmonary:     Effort: Pulmonary  effort is normal.     Breath sounds: Normal breath sounds.  Musculoskeletal: Normal range of motion.  Skin:    General: Skin is warm and dry.     Findings: No rash.  Neurological:     Mental Status: She is alert and oriented to person, place, and time.  Psychiatric:        Mood and Affect: Mood normal.        Thought Content: Thought content normal.        Judgment: Judgment normal.     Results for orders placed or performed in visit on 06/27/19  Novel Coronavirus, NAA ([facility])  Result Value Ref Range   SARS-CoV-2, NAA Not Detected Not Detected      Assessment & Plan:   Problem List Items Addressed This Visit      Other   Hyperlipemia    Self d/c'd lipitor and hoping not to restart. Will recheck lipids and review further options with patient once results return. Discussed continued lifestyle modifications      Relevant Medications   ASPIRIN 81 PO   Other Relevant Orders   Lipid Panel w/o Chol/HDL Ratio   Comprehensive metabolic panel    Other Visit Diagnoses    Genital sore    -  Primary   Resolved, will check STI panel for reassurance given pt concern. Pt to f/u if rash recurs   Relevant Orders   HIV Antibody (routine testing w rflx)   HSV(herpes simplex vrs) 1+2 ab-IgG   RPR   UA/M w/rflx Culture, Routine   GC/Chlamydia Probe Amp       Follow up plan: Return in about 6 months (around 02/12/2020) for CPE.

## 2019-08-15 NOTE — Assessment & Plan Note (Signed)
Self d/c'd lipitor and hoping not to restart. Will recheck lipids and review further options with patient once results return. Discussed continued lifestyle modifications

## 2019-08-19 LAB — GC/CHLAMYDIA PROBE AMP
Chlamydia trachomatis, NAA: NEGATIVE
Neisseria Gonorrhoeae by PCR: NEGATIVE

## 2019-08-21 ENCOUNTER — Telehealth: Payer: Self-pay | Admitting: Family Medicine

## 2019-08-21 LAB — COMPREHENSIVE METABOLIC PANEL
ALT: 16 IU/L (ref 0–32)
AST: 19 IU/L (ref 0–40)
Albumin/Globulin Ratio: 1.1 — ABNORMAL LOW (ref 1.2–2.2)
Albumin: 4 g/dL (ref 3.8–4.9)
Alkaline Phosphatase: 65 IU/L (ref 39–117)
BUN/Creatinine Ratio: 15 (ref 9–23)
BUN: 12 mg/dL (ref 6–24)
Bilirubin Total: <0.2 mg/dL (ref 0.0–1.2)
CO2: 23 mmol/L (ref 20–29)
Calcium: 8.8 mg/dL (ref 8.7–10.2)
Chloride: 104 mmol/L (ref 96–106)
Creatinine, Ser: 0.78 mg/dL (ref 0.57–1.00)
GFR calc Af Amer: 99 mL/min/{1.73_m2} (ref >59)
GFR calc non Af Amer: 86 mL/min/{1.73_m2} (ref >59)
Globulin, Total: 3.6 g/dL (ref 1.5–4.5)
Glucose: 79 mg/dL (ref 65–99)
Potassium: 4.5 mmol/L (ref 3.5–5.2)
Sodium: 140 mmol/L (ref 134–144)
Total Protein: 7.6 g/dL (ref 6.0–8.5)

## 2019-08-21 LAB — LIPID PANEL W/O CHOL/HDL RATIO
Cholesterol, Total: 307 mg/dL — ABNORMAL HIGH (ref 100–199)
HDL: 69 mg/dL (ref >39)
LDL Chol Calc (NIH): 219 mg/dL — ABNORMAL HIGH (ref 0–99)
Triglycerides: 108 mg/dL (ref 0–149)
VLDL Cholesterol Cal: 19 mg/dL (ref 5–40)

## 2019-08-21 LAB — HSV(HERPES SIMPLEX VRS) I + II AB-IGG
HSV 1 Glycoprotein G Ab, IgG: <0.91 index (ref 0.00–0.90)
HSV 2 IgG, Type Spec: >23.6 index — ABNORMAL HIGH (ref 0.00–0.90)

## 2019-08-21 LAB — RPR: RPR Ser Ql: NONREACTIVE

## 2019-08-21 LAB — HIV ANTIBODY (ROUTINE TESTING W REFLEX): HIV Screen 4th Generation wRfx: NONREACTIVE

## 2019-08-21 NOTE — Telephone Encounter (Signed)
Called yesterday just before noon and again just now, no answer and no ability to leave VM. Please let her know that her cholesterol remains significantly elevated and I strongly recommend she restart her cholesterol medication. All of her STI testing came back negative other than the herpes virus which does not require treatment unless having active sores. If she is willing to get back on cholesterol medicine I will re-send it

## 2019-08-22 ENCOUNTER — Ambulatory Visit
Admission: RE | Admit: 2019-08-22 | Discharge: 2019-08-22 | Disposition: A | Discharge status: Home / Self Care | Payer: Managed Care, Other (non HMO) | Source: Ambulatory Visit | Attending: Family Medicine | Admitting: Family Medicine

## 2019-08-22 ENCOUNTER — Other Ambulatory Visit: Payer: Self-pay

## 2019-08-22 DIAGNOSIS — Z1231 Encounter for screening mammogram for malignant neoplasm of breast: Secondary | ICD-10-CM

## 2019-08-22 NOTE — Telephone Encounter (Signed)
Called patient, no answer, unable to leave a message.

## 2019-08-23 MED ORDER — ATORVASTATIN CALCIUM 80 MG PO TABS: 80.0000 mg | ORAL_TABLET | Freq: Every day | ORAL | 1 refills | Status: DC

## 2019-08-23 NOTE — Telephone Encounter (Signed)
Rx sent 

## 2019-08-23 NOTE — Telephone Encounter (Signed)
Patient notified of results. Patient is agreeable to restarting cholesterol medication and would like it sent to [facility] in [place].

## 2019-09-12 NOTE — Telephone Encounter (Signed)
Routing to provider  

## 2019-09-13 NOTE — Telephone Encounter (Signed)
Patient notified

## 2019-09-13 NOTE — Telephone Encounter (Signed)
We do not have on record that she's been on this nor have I ever seen her for something related. Needs appt or needs to request refill from prescribing provider.

## 2019-12-15 ENCOUNTER — Ambulatory Visit
Admission: EM | Admit: 2019-12-15 | Discharge: 2019-12-15 | Disposition: A | Discharge status: Home / Self Care | Payer: Managed Care, Other (non HMO) | Attending: Family Medicine | Admitting: Family Medicine

## 2019-12-15 ENCOUNTER — Ambulatory Visit (INDEPENDENT_AMBULATORY_CARE_PROVIDER_SITE_OTHER): Payer: Managed Care, Other (non HMO)

## 2019-12-15 ENCOUNTER — Encounter: Payer: Self-pay | Admitting: Emergency Medicine

## 2019-12-15 ENCOUNTER — Other Ambulatory Visit: Payer: Self-pay

## 2019-12-15 DIAGNOSIS — M25561 Pain in right knee: Secondary | ICD-10-CM

## 2019-12-15 DIAGNOSIS — M545 Low back pain, unspecified: Secondary | ICD-10-CM

## 2019-12-15 MED ORDER — MELOXICAM 15 MG PO TABS: 15.0000 mg | ORAL_TABLET | Freq: Every day | ORAL | 0 refills | Status: DC | PRN

## 2019-12-15 NOTE — Discharge Instructions (Addendum)
Recommend trial Mobic 15mg  once daily as needed for pain. May use warm heat on area for comfort. Encourage to stretch back muscles. May benefit from Physical Therapy. Recommend follow-up with an Orthopedic for further evaluation of back pain and occasional knee pain.

## 2019-12-15 NOTE — ED Provider Notes (Signed)
[facility]    CSN: [id number] Arrival date & time: 12/15/19  [id number]      History   Chief Complaint Chief Complaint  Patient presents with  . Back Pain    HPI Sara Greer is a 59 y.o. female.   59 year old female presents with left lower back pain that has been occurring for over a month but worse the past few weeks. Denies any distinct injury or fall. Has noticed that pain is worse when changing positions and will improve some with continuous movement. Denies any radiation of pain down her leg. Denies any numbness. No change in bowel or bladder habits. Has also noticed occasional pain in her right knee but not currently bothering her today. Concerned over a serious etiology of her back pain. Has not had any imaging that she can recall. Has tried [facility] powders and Tylenol with some help but not much relief in the past few days. Other chronic health issues include hyperlipidemia and currently takes Lipitor and aspirin daily.   The history is provided by the patient.    Past Medical History:  Diagnosis Date  . Hyperlipemia     Patient Active Problem List   Diagnosis Date Noted  . Allergic rhinitis 03/19/2018  . Hyperlipemia 12/30/2017  . Chronic left-sided low back pain without sciatica 07/12/2017    Past Surgical History:  Procedure Laterality Date  . BREAST BIOPSY Left 05/24/2018   neg/ bx/clip/u/s  . CESAREAN SECTION    . MYOMECTOMY    . OOPHORECTOMY    . TUBAL LIGATION    . VAGINAL HYSTERECTOMY      OB History    Gravida  3   Para  3   Term  3   Preterm      AB      Living  3     SAB      TAB      Ectopic      Multiple      Live Births  3            Home Medications    Prior to Admission medications   Medication Sig Start Date End Date Taking? Authorizing Provider  ASPIRIN 81 PO Take by mouth as needed.   Yes [provider]  atorvastatin (LIPITOR) 80 MG tablet Take 1 tablet (80 mg total) by mouth daily.  08/23/19  Yes Volney American, PA-C  meloxicam (MOBIC) 15 MG tablet Take 1 tablet (15 mg total) by mouth daily as needed for pain. 12/15/19   Katy Apo, NP  fluticasone (FLONASE) 50 MCG/ACT nasal spray Place 2 sprays into both nostrils 2 (two) times daily. Patient not taking: Reported on 08/15/2019 03/19/18 12/15/19  Volney American, PA-C    Family History Family History  Problem Relation Age of Onset  . Hypertension Mother   . Gout Mother   . Heart disease Mother   . Breast cancer Maternal Aunt 60  . Stroke Maternal Aunt   . Sickle cell trait Other   . Diabetes Other   . Alcohol abuse Father   . Kidney disease Brother   . Heart attack Paternal Aunt   . Colon cancer Paternal Uncle   . Alcohol abuse Brother   . Alcohol abuse Brother   . Breast cancer Cousin 16       mat cousin    Social History Social History   Tobacco Use  . Smoking status: Never Smoker  . Smokeless tobacco:  Never Used  Substance Use Topics  . Alcohol use: Yes    Comment: rarely  . Drug use: No     Allergies   Patient has no known allergies.   Review of Systems Review of Systems  Constitutional: Negative for activity change, appetite change, chills, fatigue and fever.  Eyes: Negative for photophobia, pain and visual disturbance.  Respiratory: Negative for cough, chest tightness, shortness of breath and wheezing.   Cardiovascular: Negative for chest pain and palpitations.  Gastrointestinal: Positive for constipation (chronic). Negative for abdominal pain, blood in stool, diarrhea, nausea and vomiting.  Genitourinary: Negative for decreased urine volume, difficulty urinating, dysuria, enuresis, flank pain, hematuria, pelvic pain, urgency and vaginal discharge.  Musculoskeletal: Positive for arthralgias (right knee) and back pain. Negative for gait problem, joint swelling, neck pain and neck stiffness.  Skin: Negative for color change, rash and wound.  Allergic/Immunologic: Positive for  environmental allergies. Negative for food allergies and immunocompromised state.  Neurological: Negative for dizziness, tremors, seizures, syncope, weakness, light-headedness, numbness and headaches.  Hematological: Negative for adenopathy. Does not bruise/bleed easily.  Psychiatric/Behavioral: Negative.      Physical Exam Triage Vital Signs ED Triage Vitals  Enc Vitals Group     BP 12/15/19 0942 133/67     Pulse Rate 12/15/19 0942 62     Resp 12/15/19 0942 16     Temp 12/15/19 0942 98.5 F (36.9 C)     Temp Source 12/15/19 0942 Oral     SpO2 12/15/19 0942 100 %     Weight 12/15/19 0939 150 lb (68 kg)     Height 12/15/19 0939 5\' 2"  (1.575 m)     Head Circumference --      Peak Flow --      Pain Score 12/15/19 0939 0     Pain Loc --      Pain Edu? --      Excl. in [place]? --    No data found.  Updated Vital Signs BP 133/67 (BP Location: Right Arm)   Pulse 62   Temp 98.5 F (36.9 C) (Oral)   Resp 16   Ht 5\' 2"  (1.575 m)   Wt 150 lb (68 kg)   SpO2 100%   BMI 27.44 kg/m   Visual Acuity Right Eye Distance:   Left Eye Distance:   Bilateral Distance:    Right Eye Near:   Left Eye Near:    Bilateral Near:     Physical Exam Vitals and nursing note reviewed.  Constitutional:      General: She is awake. She is not in acute distress.    Appearance: Normal appearance. She is well-developed and well-groomed. She is not ill-appearing.     Comments: Patient sitting in exam chair in minimal discomfort until she changing positions.   HENT:     Head: Normocephalic and atraumatic.  Eyes:     Extraocular Movements: Extraocular movements intact.     Conjunctiva/sclera: Conjunctivae normal.  Cardiovascular:     Rate and Rhythm: Normal rate and regular rhythm.     Heart sounds: Normal heart sounds. No murmur.  Pulmonary:     Effort: Pulmonary effort is normal. No respiratory distress.     Breath sounds: Normal breath sounds and air entry. No decreased breath sounds, wheezing or  rhonchi.  Abdominal:     General: Abdomen is flat. Bowel sounds are normal.     Palpations: Abdomen is soft.     Tenderness: There is no abdominal tenderness. There is  no right CVA tenderness or left CVA tenderness.  Musculoskeletal:        General: No swelling or tenderness.     Cervical back: Normal, normal range of motion and neck supple.     Thoracic back: Normal.     Lumbar back: Spasms (left) present. No swelling, edema, deformity, tenderness or bony tenderness. Decreased range of motion. Negative right straight leg raise test and negative left straight leg raise test.       Back:     Right knee: Normal. No swelling, deformity, effusion or erythema. Normal range of motion. No tenderness.     Left knee: Normal. No swelling, deformity, effusion or erythema. Normal range of motion. No tenderness.     Comments: Decreased range of motion of lower lumbar area of back, especially with full flexion and extension. No distinct tenderness. Muscle spasms felt in left lower lumbar area. No swelling or rash. No neuro deficits noted. Good distal pulses and capillary refill.   Skin:    General: Skin is warm and dry.     Capillary Refill: Capillary refill takes less than 2 seconds.     Findings: No rash.  Neurological:     General: No focal deficit present.     Mental Status: She is alert and oriented to person, place, and time.     Sensory: Sensation is intact. No sensory deficit.     Motor: Motor function is intact. No weakness.     Coordination: Coordination is intact. Coordination normal.     Gait: Gait is intact. Gait normal.     Deep Tendon Reflexes: Reflexes are normal and symmetric. Reflexes normal.  Psychiatric:        Mood and Affect: Mood normal.        Behavior: Behavior normal. Behavior is cooperative.        Thought Content: Thought content normal.        Judgment: Judgment normal.      UC Treatments / Results  Labs (all labs ordered are listed, but only abnormal results are  displayed) Labs Reviewed - No data to display  EKG   Radiology DG Lumbar Spine Complete  Result Date: 12/15/2019 CLINICAL DATA:  Pt c/o lower back pain radiating down left side for several months but worsening over past few wks. NKI. EXAM: LUMBAR SPINE - COMPLETE 4+ VIEW COMPARISON:  None. FINDINGS: Five non-rib-bearing lumbar type vertebral bodies. Vertebral body heights are maintained without evidence of compression fracture. Intervertebral disc spaces are well maintained. No focal bone lesion. There is mild multilevel anterior spurring. Lower lumbar facet hypertrophy. SI joints are open. There is atherosclerotic calcification in the abdominal aorta. Nonobstructive bowel gas pattern. IMPRESSION: 1. No acute bony abnormality of the lumbar spine. 2. Mild multilevel degenerative changes. 3. Aortic atherosclerosis. Electronically Signed   By: Audie Pinto M.D.   On: 12/15/2019 11:19    Procedures Procedures (including critical care time)  Medications Ordered in UC Medications - No data to display  Initial Impression / Assessment and Plan / UC Course  I have reviewed the triage vital signs and the nursing notes.  Pertinent labs & imaging results that were available during my care of the patient were reviewed by me and considered in my medical decision making (see chart for details).    Reviewed spinal x-ray results with patient- no distinct fracture. Some degenerative/arthritic changes present. Discussed that pain and symptoms may be intermittent with flare-ups but most likely chronic. Recommend trial Mobic 15mg  daily as  needed for pain. May use warm heat on area for comfort. Encouraged to perform back stretching and strengthening exercises- information provided. May benefit from Physical Therapy. Patient desires evaluation with an Orthopedic for back and knee pain- information provided on 2 different local Orthopedic groups. Follow-up with an Orthopedic as needed.  Final Clinical  Impressions(s) / UC Diagnoses   Final diagnoses:  Acute left-sided low back pain without sciatica  Right anterior knee pain     Discharge Instructions     Recommend trial Mobic 15mg  once daily as needed for pain. May use warm heat on area for comfort. Encourage to stretch back muscles. May benefit from Physical Therapy. Recommend follow-up with an Orthopedic for further evaluation of back pain and occasional knee pain.     ED Prescriptions    Medication Sig Dispense Auth. Provider   meloxicam (MOBIC) 15 MG tablet Take 1 tablet (15 mg total) by mouth daily as needed for pain. 30 tablet Kylyn Sookram, Nicholes Stairs, NP     PDMP not reviewed this encounter.   Katy Apo, NP 12/16/19 [phone]

## 2019-12-15 NOTE — ED Triage Notes (Signed)
Patient c/o lower back pain for over a month.  Patient denies injury or fall.

## 2020-02-12 ENCOUNTER — Encounter: Payer: Self-pay | Admitting: Family Medicine

## 2020-02-12 ENCOUNTER — Other Ambulatory Visit: Payer: Self-pay

## 2020-02-12 ENCOUNTER — Ambulatory Visit (INDEPENDENT_AMBULATORY_CARE_PROVIDER_SITE_OTHER): Payer: Managed Care, Other (non HMO) | Admitting: Family Medicine

## 2020-02-12 VITALS — BP 123/78 | HR 49 | Temp 98.2°F | Ht 61.0 in | Wt 151.0 lb

## 2020-02-12 DIAGNOSIS — Z1211 Encounter for screening for malignant neoplasm of colon: Secondary | ICD-10-CM | POA: Diagnosis not present

## 2020-02-12 DIAGNOSIS — E785 Hyperlipidemia, unspecified: Secondary | ICD-10-CM | POA: Diagnosis not present

## 2020-02-12 DIAGNOSIS — Z1231 Encounter for screening mammogram for malignant neoplasm of breast: Secondary | ICD-10-CM | POA: Diagnosis not present

## 2020-02-12 DIAGNOSIS — Z0000 Encounter for general adult medical examination without abnormal findings: Secondary | ICD-10-CM

## 2020-02-12 DIAGNOSIS — M1990 Unspecified osteoarthritis, unspecified site: Secondary | ICD-10-CM | POA: Insufficient documentation

## 2020-02-12 DIAGNOSIS — J309 Allergic rhinitis, unspecified: Secondary | ICD-10-CM | POA: Diagnosis not present

## 2020-02-12 DIAGNOSIS — M47896 Other spondylosis, lumbar region: Secondary | ICD-10-CM

## 2020-02-12 DIAGNOSIS — I700 Atherosclerosis of aorta: Secondary | ICD-10-CM | POA: Insufficient documentation

## 2020-02-12 LAB — UA/M W/RFLX CULTURE, ROUTINE
Bilirubin, UA: NEGATIVE
Glucose, UA: NEGATIVE
Ketones, UA: NEGATIVE
Leukocytes,UA: NEGATIVE
Nitrite, UA: NEGATIVE
Protein,UA: NEGATIVE
RBC, UA: NEGATIVE
Specific Gravity, UA: 1.01 (ref 1.005–1.030)
Urobilinogen, Ur: 0.2 mg/dL (ref 0.2–1.0)
pH, UA: 5.5 (ref 5.0–7.5)

## 2020-02-12 MED ORDER — ATORVASTATIN CALCIUM 80 MG PO TABS: 80.0000 mg | ORAL_TABLET | Freq: Every day | ORAL | 1 refills | Status: DC

## 2020-02-12 NOTE — Progress Notes (Signed)
BP 123/78   Pulse (!) 49   Temp 98.2 F (36.8 C) (Oral)   Ht 5\' 1"  (1.549 m)   Wt 151 lb (68.5 kg)   SpO2 97%   BMI 28.53 kg/m    Subjective:    Patient ID: Sara Greer, female    DOB: 05/16/1961, 60 y.o.   MRN: [id number]  HPI: Sara Greer is a 59 y.o. female presenting on 02/12/2020 for comprehensive medical examination. Current medical complaints include:see below  Having low back pain, went to UC 2 months ago and had x-ray taken which showed mild arthritis. Worst first thing in the morning and when laying still in bed. Gets better when up and moving around. Was referred to Orthopedics from there and confirmed the dx of arthritis. Taking tylenol arthritis and meloxicam prn but has run out of meloxicam. Has tried voltaren gel with minimal relief.   Ran out of lipitor about 3 weeks ago, but was taking faithfully and tolerating well. Denies CP, SOB, claudication, myalgias. Trying to eat well, stays very active.   Allergies - Uses flonase as needed with good control. Denies recent flares.   She currently lives with: Menopausal Symptoms: no  Depression Screen done today and results listed below:  Depression screen [facility] 2/9 02/12/2020 07/12/2017  Decreased Interest 0 1  Down, Depressed, Hopeless 0 0  PHQ - 2 Score 0 1  Altered sleeping 0 2  Tired, decreased energy 0 1  Change in appetite 0 1  Feeling bad or failure about yourself  0 0  Trouble concentrating 0 0  Moving slowly or fidgety/restless 0 0  Suicidal thoughts 0 0  PHQ-9 Score 0 5    The patient does not have a history of falls. I did complete a risk assessment for falls. A plan of care for falls was documented.   Past Medical History:  Past Medical History:  Diagnosis Date  . Hyperlipemia     Surgical History:  Past Surgical History:  Procedure Laterality Date  . BREAST BIOPSY Left 05/24/2018   neg/ bx/clip/u/s  . CESAREAN SECTION    . MYOMECTOMY    . OOPHORECTOMY    . TUBAL  LIGATION    . VAGINAL HYSTERECTOMY      Medications:  Current Outpatient Medications on File Prior to Visit  Medication Sig  . [DISCONTINUED] fluticasone (FLONASE) 50 MCG/ACT nasal spray Place 2 sprays into both nostrils 2 (two) times daily. (Patient not taking: Reported on 08/15/2019)   No current facility-administered medications on file prior to visit.    Allergies:  No Known Allergies  Social History:  Social History   Socioeconomic History  . Marital status: Single    Spouse name: Not on file  . Number of children: Not on file  . Years of education: Not on file  . Highest education level: Not on file  Occupational History  . Not on file  Tobacco Use  . Smoking status: Never Smoker  . Smokeless tobacco: Never Used  Substance and Sexual Activity  . Alcohol use: Yes    Comment: rarely  . Drug use: No  . Sexual activity: Never  Other Topics Concern  . Not on file  Social History Narrative  . Not on file   Social Determinants of Health   Financial Resource Strain:   . Difficulty of Paying Living Expenses:   Food Insecurity:   . Worried About Charity fundraiser in the Last Year:   . [employer] of  Food in the Last Year:   Transportation Needs:   . Film/video editor (Medical):   Marland Kitchen Lack of Transportation (Non-Medical):   Physical Activity:   . Days of Exercise per Week:   . Minutes of Exercise per Session:   Stress:   . Feeling of Stress :   Social Connections:   . Frequency of Communication with Friends and Family:   . Frequency of Social Gatherings with Friends and Family:   . Attends Religious Services:   . Active Member of Clubs or Organizations:   . Attends Archivist Meetings:   Marland Kitchen Marital Status:   Intimate Partner Violence:   . Fear of Current or Ex-Partner:   . Emotionally Abused:   Marland Kitchen Physically Abused:   . Sexually Abused:    Social History   Tobacco Use  Smoking Status Never Smoker  Smokeless Tobacco Never Used   Social History    Substance and Sexual Activity  Alcohol Use Yes   Comment: rarely    Family History:  Family History  Problem Relation Age of Onset  . Hypertension Mother   . Gout Mother   . Heart disease Mother   . Breast cancer Maternal Aunt 60  . Stroke Maternal Aunt   . Sickle cell trait Other   . Diabetes Other   . Alcohol abuse Father   . Kidney disease Brother   . Heart attack Paternal Aunt   . Colon cancer Paternal Uncle   . Alcohol abuse Brother   . Alcohol abuse Brother   . Breast cancer Cousin 10       mat cousin    Past medical history, surgical history, medications, allergies, family history and social history reviewed with patient today and changes made to appropriate areas of the chart.   Review of Systems - General ROS: negative Psychological ROS: negative Ophthalmic ROS: negative ENT ROS: negative Allergy and Immunology ROS: negative Hematological and Lymphatic ROS: negative Endocrine ROS: negative Breast ROS: negative for breast lumps Respiratory ROS: no cough, shortness of breath, or wheezing Cardiovascular ROS: no chest pain or dyspnea on exertion Gastrointestinal ROS: no abdominal pain, change in bowel habits, or black or bloody stools Genito-Urinary ROS: no dysuria, trouble voiding, or hematuria Musculoskeletal ROS: positive for - joint pain Neurological ROS: no TIA or stroke symptoms Dermatological ROS: negative All other ROS negative except what is listed above and in the HPI.      Objective:    BP 123/78   Pulse (!) 49   Temp 98.2 F (36.8 C) (Oral)   Ht 5\' 1"  (1.549 m)   Wt 151 lb (68.5 kg)   SpO2 97%   BMI 28.53 kg/m   Wt Readings from Last 3 Encounters:  02/12/20 151 lb (68.5 kg)  12/15/19 150 lb (68 kg)  08/15/19 162 lb (73.5 kg)    Physical Exam Vitals and nursing note reviewed. Exam conducted with a chaperone present.  Constitutional:      General: She is not in acute distress.    Appearance: She is well-developed.  HENT:     Head:  Atraumatic.     Right Ear: External ear normal.     Left Ear: External ear normal.     Nose: Nose normal.     Mouth/Throat:     Pharynx: No oropharyngeal exudate.  Eyes:     General: No scleral icterus.    Conjunctiva/sclera: Conjunctivae normal.     Pupils: Pupils are equal, round, and reactive to light.  Neck:     Thyroid: No thyromegaly.  Cardiovascular:     Rate and Rhythm: Normal rate and regular rhythm.     Heart sounds: Normal heart sounds.  Pulmonary:     Effort: Pulmonary effort is normal. No respiratory distress.     Breath sounds: Normal breath sounds.  Chest:     Breasts:        Right: No mass, skin change or tenderness.        Left: No mass, skin change or tenderness.  Abdominal:     General: Bowel sounds are normal.     Palpations: Abdomen is soft. There is no mass.     Tenderness: There is no abdominal tenderness.  Genitourinary:    Comments: GU exam declined Musculoskeletal:        General: No tenderness. Normal range of motion.     Cervical back: Normal range of motion and neck supple.  Lymphadenopathy:     Cervical: No cervical adenopathy.     Upper Body:     Right upper body: No axillary adenopathy.     Left upper body: No axillary adenopathy.  Skin:    General: Skin is warm and dry.     Findings: No rash.  Neurological:     Mental Status: She is alert and oriented to person, place, and time.     Cranial Nerves: No cranial nerve deficit.  Psychiatric:        Behavior: Behavior normal.     Results for orders placed or performed in visit on 02/12/20  CBC with Differential/Platelet  Result Value Ref Range   WBC 4.8 3.4 - 10.8 x10E3/uL   RBC 4.85 3.77 - 5.28 x10E6/uL   Hemoglobin 13.0 11.1 - 15.9 g/dL   Hematocrit 40.7 34.0 - 46.6 %   MCV 84 79 - 97 fL   MCH 26.8 26.6 - 33.0 pg   MCHC 31.9 31.5 - 35.7 g/dL   RDW 13.1 11.7 - 15.4 %   Platelets 240 150 - 450 x10E3/uL   Neutrophils 48 Not Estab. %   Lymphs 40 Not Estab. %   Monocytes 7 Not  Estab. %   Eos 4 Not Estab. %   Basos 1 Not Estab. %   Neutrophils Absolute 2.3 1.4 - 7.0 x10E3/uL   Lymphocytes Absolute 1.9 0.7 - 3.1 x10E3/uL   Monocytes Absolute 0.4 0.1 - 0.9 x10E3/uL   EOS (ABSOLUTE) 0.2 0.0 - 0.4 x10E3/uL   Basophils Absolute 0.0 0.0 - 0.2 x10E3/uL   Immature Granulocytes 0 Not Estab. %   Immature Grans (Abs) 0.0 0.0 - 0.1 x10E3/uL  Comprehensive metabolic panel  Result Value Ref Range   Glucose 74 65 - 99 mg/dL   BUN 8 6 - 24 mg/dL   Creatinine, Ser 0.73 0.57 - 1.00 mg/dL   GFR calc non Af Amer 92 >59 mL/min/1.73   GFR calc Af Amer 106 >59 mL/min/1.73   BUN/Creatinine Ratio 11 9 - 23   Sodium 141 134 - 144 mmol/L   Potassium 4.6 3.5 - 5.2 mmol/L   Chloride 104 96 - 106 mmol/L   CO2 25 20 - 29 mmol/L   Calcium 9.0 8.7 - 10.2 mg/dL   Total Protein 7.7 6.0 - 8.5 g/dL   Albumin 4.1 3.8 - 4.9 g/dL   Globulin, Total 3.6 1.5 - 4.5 g/dL   Albumin/Globulin Ratio 1.1 (L) 1.2 - 2.2   Bilirubin Total 0.2 0.0 - 1.2 mg/dL   Alkaline Phosphatase 80 39 - 117  IU/L   AST 84 (H) 0 - 40 IU/L   ALT 85 (H) 0 - 32 IU/L  Lipid Panel w/o Chol/HDL Ratio  Result Value Ref Range   Cholesterol, Total 226 (H) 100 - 199 mg/dL   Triglycerides 100 0 - 149 mg/dL   HDL 66 >39 mg/dL   VLDL Cholesterol Cal 18 5 - 40 mg/dL   LDL Chol Calc (NIH) 142 (H) 0 - 99 mg/dL  UA/M w/rflx Culture, Routine   Specimen: Urine   URINE  Result Value Ref Range   Specific Gravity, UA 1.010 1.005 - 1.030   pH, UA 5.5 5.0 - 7.5   Color, UA Yellow Yellow   Appearance Ur Clear Clear   Leukocytes,UA Negative Negative   Protein,UA Negative Negative/Trace   Glucose, UA Negative Negative   Ketones, UA Negative Negative   RBC, UA Negative Negative   Bilirubin, UA Negative Negative   Urobilinogen, Ur 0.2 0.2 - 1.0 mg/dL   Nitrite, UA Negative Negative  TSH  Result Value Ref Range   TSH 2.190 0.450 - 4.500 uIU/mL      Assessment & Plan:   Problem List Items Addressed This Visit       Cardiovascular and Mediastinum   Aortic atherosclerosis (HCC)    Recheck lipids, adjust as needed. Continue current regimen        Respiratory   Allergic rhinitis    Stable and well controlled, continue current regimen        Musculoskeletal and Integument   Osteoarthritis    Multiple sites, most recently having low back pain with arthritic changes seen on x-ray. OTC pain relievers, lidocaine patches, muscle rubs        Other   Hyperlipemia - Primary    Stable, tolerating high dose lipitor well. Continue lifestyle modifications and current regimen and recheck lipids      Relevant Orders   Comprehensive metabolic panel (Completed)   Lipid Panel w/o Chol/HDL Ratio (Completed)    Other Visit Diagnoses    Annual physical exam       Relevant Orders   CBC with Differential/Platelet (Completed)   UA/M w/rflx Culture, Routine (Completed)   TSH (Completed)   Screening for colon cancer       Relevant Orders   Cologuard   Encounter for screening mammogram for malignant neoplasm of breast       Relevant Orders   MM DIGITAL SCREENING BILATERAL       Follow up plan: Return in about 6 months (around 08/14/2020) for 6 month f/u.   LABORATORY TESTING:  - Pap smear: not applicable  IMMUNIZATIONS:   - Tdap: Tetanus vaccination status reviewed: last tetanus booster within 10 years. - Influenza: Refused  SCREENING: -Mammogram: Ordered today  - Colonoscopy: Ordered today   PATIENT COUNSELING:   Advised to take 1 mg of folate supplement per day if capable of pregnancy.   Sexuality: Discussed sexually transmitted diseases, partner selection, use of condoms, avoidance of unintended pregnancy  and contraceptive alternatives.   Advised to avoid cigarette smoking.  I discussed with the patient that most people either abstain from alcohol or drink within safe limits (<=14/week and <=4 drinks/occasion for males, <=7/weeks and <= 3 drinks/occasion for females) and that the risk for  alcohol disorders and other health effects rises proportionally with the number of drinks per week and how often a drinker exceeds daily limits.  Discussed cessation/primary prevention of drug use and availability of treatment for abuse.   Diet: Encouraged  to adjust caloric intake to maintain  or achieve ideal body weight, to reduce intake of dietary saturated fat and total fat, to limit sodium intake by avoiding high sodium foods and not adding table salt, and to maintain adequate dietary potassium and calcium preferably from fresh fruits, vegetables, and low-fat dairy products.    stressed the importance of regular exercise  Injury prevention: Discussed safety belts, safety helmets, smoke detector, smoking near bedding or upholstery.   Dental health: Discussed importance of regular tooth brushing, flossing, and dental visits.    NEXT PREVENTATIVE PHYSICAL DUE IN 1 YEAR. Return in about 6 months (around 08/14/2020) for 6 month f/u.

## 2020-02-12 NOTE — Patient Instructions (Signed)
Can try glucosamine chondroitin or tumeric supplements for inflammation/joint health

## 2020-02-13 LAB — COMPREHENSIVE METABOLIC PANEL
ALT: 85 IU/L — ABNORMAL HIGH (ref 0–32)
AST: 84 IU/L — ABNORMAL HIGH (ref 0–40)
Albumin/Globulin Ratio: 1.1 — ABNORMAL LOW (ref 1.2–2.2)
Albumin: 4.1 g/dL (ref 3.8–4.9)
Alkaline Phosphatase: 80 IU/L (ref 39–117)
BUN/Creatinine Ratio: 11 (ref 9–23)
BUN: 8 mg/dL (ref 6–24)
Bilirubin Total: 0.2 mg/dL (ref 0.0–1.2)
CO2: 25 mmol/L (ref 20–29)
Calcium: 9 mg/dL (ref 8.7–10.2)
Chloride: 104 mmol/L (ref 96–106)
Creatinine, Ser: 0.73 mg/dL (ref 0.57–1.00)
GFR calc Af Amer: 106 mL/min/{1.73_m2} (ref >59)
GFR calc non Af Amer: 92 mL/min/{1.73_m2} (ref >59)
Globulin, Total: 3.6 g/dL (ref 1.5–4.5)
Glucose: 74 mg/dL (ref 65–99)
Potassium: 4.6 mmol/L (ref 3.5–5.2)
Sodium: 141 mmol/L (ref 134–144)
Total Protein: 7.7 g/dL (ref 6.0–8.5)

## 2020-02-13 LAB — CBC WITH DIFFERENTIAL/PLATELET
Basophils Absolute: 0 10*3/uL (ref 0.0–0.2)
Basos: 1 %
EOS (ABSOLUTE): 0.2 10*3/uL (ref 0.0–0.4)
Eos: 4 %
Hematocrit: 40.7 % (ref 34.0–46.6)
Hemoglobin: 13 g/dL (ref 11.1–15.9)
Immature Grans (Abs): 0 10*3/uL (ref 0.0–0.1)
Immature Granulocytes: 0 %
Lymphocytes Absolute: 1.9 10*3/uL (ref 0.7–3.1)
Lymphs: 40 %
MCH: 26.8 pg (ref 26.6–33.0)
MCHC: 31.9 g/dL (ref 31.5–35.7)
MCV: 84 fL (ref 79–97)
Monocytes Absolute: 0.4 10*3/uL (ref 0.1–0.9)
Monocytes: 7 %
Neutrophils Absolute: 2.3 10*3/uL (ref 1.4–7.0)
Neutrophils: 48 %
Platelets: 240 10*3/uL (ref 150–450)
RBC: 4.85 x10E6/uL (ref 3.77–5.28)
RDW: 13.1 % (ref 11.7–15.4)
WBC: 4.8 10*3/uL (ref 3.4–10.8)

## 2020-02-13 LAB — TSH: TSH: 2.19 u[IU]/mL (ref 0.450–4.500)

## 2020-02-13 LAB — LIPID PANEL W/O CHOL/HDL RATIO
Cholesterol, Total: 226 mg/dL — ABNORMAL HIGH (ref 100–199)
HDL: 66 mg/dL (ref >39)
LDL Chol Calc (NIH): 142 mg/dL — ABNORMAL HIGH (ref 0–99)
Triglycerides: 100 mg/dL (ref 0–149)
VLDL Cholesterol Cal: 18 mg/dL (ref 5–40)

## 2020-02-20 ENCOUNTER — Telehealth: Payer: Self-pay | Admitting: Family Medicine

## 2020-02-20 MED ORDER — EZETIMIBE 10 MG PO TABS: 10.0000 mg | ORAL_TABLET | Freq: Every day | ORAL | 0 refills | Status: DC

## 2020-02-20 MED ORDER — ATORVASTATIN CALCIUM 40 MG PO TABS: 40.0000 mg | ORAL_TABLET | Freq: Every day | ORAL | 0 refills | Status: DC

## 2020-02-20 NOTE — Telephone Encounter (Signed)
Patient notified

## 2020-02-20 NOTE — Telephone Encounter (Signed)
Called, unable to leave VM to return call. Please let her know her cholesterol improved significantly on the high dose lipitor, but her liver enzymes are up a bit which may be from the medication or if she's been drinking any alcohol, taking lots of tylenol, etc. Please ask her to avoid those liver irritants and lets decrease back to 40 mg lipitor (will send in new dose but she can cut in half if she's still got some 80 mg tabs) and add zetia.   **Please schedule 3 month f/u to recheck things and continue working on diet and exercise

## 2020-02-23 NOTE — Assessment & Plan Note (Signed)
Stable and well controlled, continue current regimen 

## 2020-02-23 NOTE — Assessment & Plan Note (Signed)
Stable, tolerating high dose lipitor well. Continue lifestyle modifications and current regimen and recheck lipids

## 2020-02-23 NOTE — Assessment & Plan Note (Signed)
Multiple sites, most recently having low back pain with arthritic changes seen on x-ray. OTC pain relievers, lidocaine patches, muscle rubs

## 2020-02-23 NOTE — Assessment & Plan Note (Signed)
Recheck lipids, adjust as needed. Continue current regimen 

## 2020-03-10 LAB — COLOGUARD: Cologuard: NEGATIVE

## 2020-03-13 LAB — COLOGUARD: Cologuard: NEGATIVE

## 2020-05-01 ENCOUNTER — Other Ambulatory Visit: Payer: Self-pay

## 2020-05-01 MED ORDER — ATORVASTATIN CALCIUM 40 MG PO TABS: 40.0000 mg | ORAL_TABLET | Freq: Every day | ORAL | 0 refills | Status: DC

## 2020-05-01 MED ORDER — EZETIMIBE 10 MG PO TABS: 10.0000 mg | ORAL_TABLET | Freq: Every day | ORAL | 0 refills | Status: DC

## 2020-05-01 NOTE — Telephone Encounter (Signed)
Now using mail order pharmacy and needs new prescriptions sent to them please.

## 2020-05-07 ENCOUNTER — Other Ambulatory Visit: Payer: Self-pay | Admitting: Family Medicine

## 2020-05-10 IMAGING — US US BREAST*L* LIMITED INC AXILLA
1 series · 6 of 6 positions shown · non-contrast
Comparison: 04/26/2018

CLINICAL DATA: 56-year-old patient recalled from recent screening
mammogram for evaluation of possible mass in the left breast.

EXAM:
DIGITAL DIAGNOSTIC LEFT MAMMOGRAM WITH CAD AND TOMO
ULTRASOUND LEFT BREAST

[Series 1: us breast*left* limited inc axilla · 0.06mm/px · 6 of 6 slices shown]
[im 1/6]
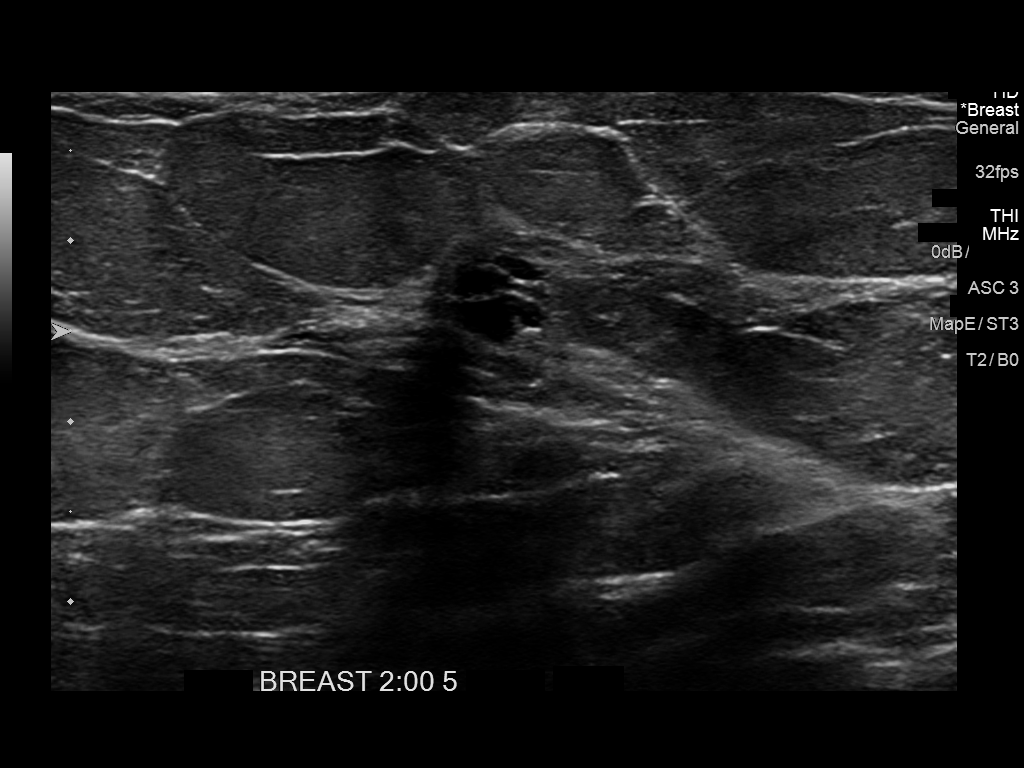
[im 2/6]
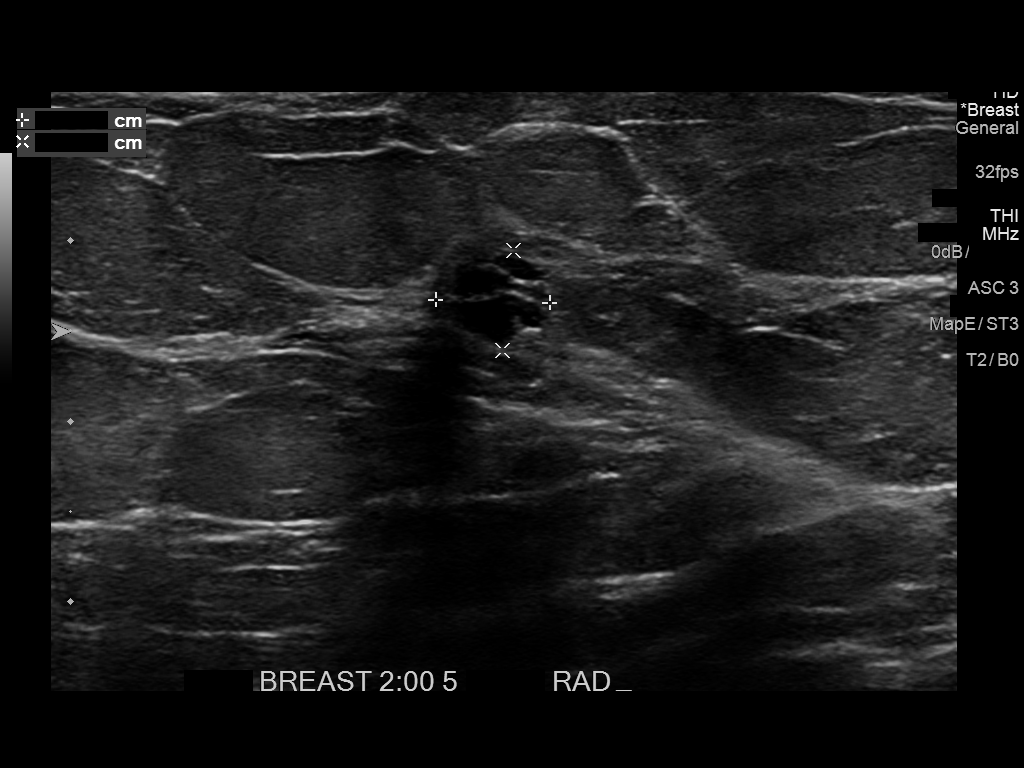
[im 3/6]
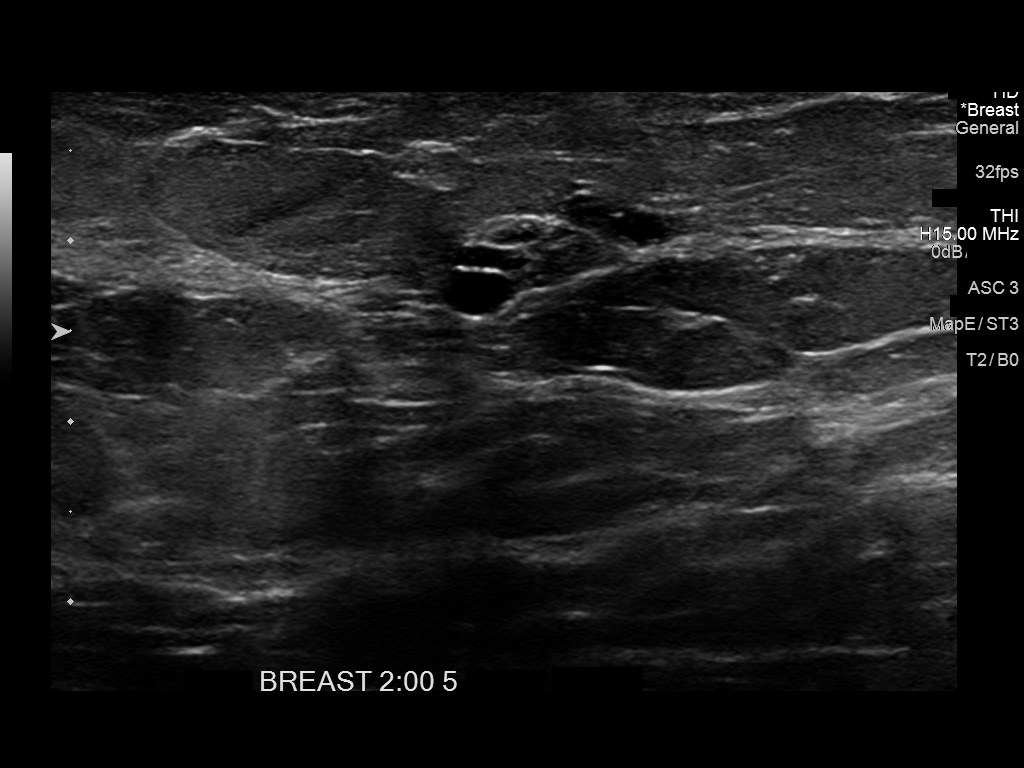
[im 4/6]
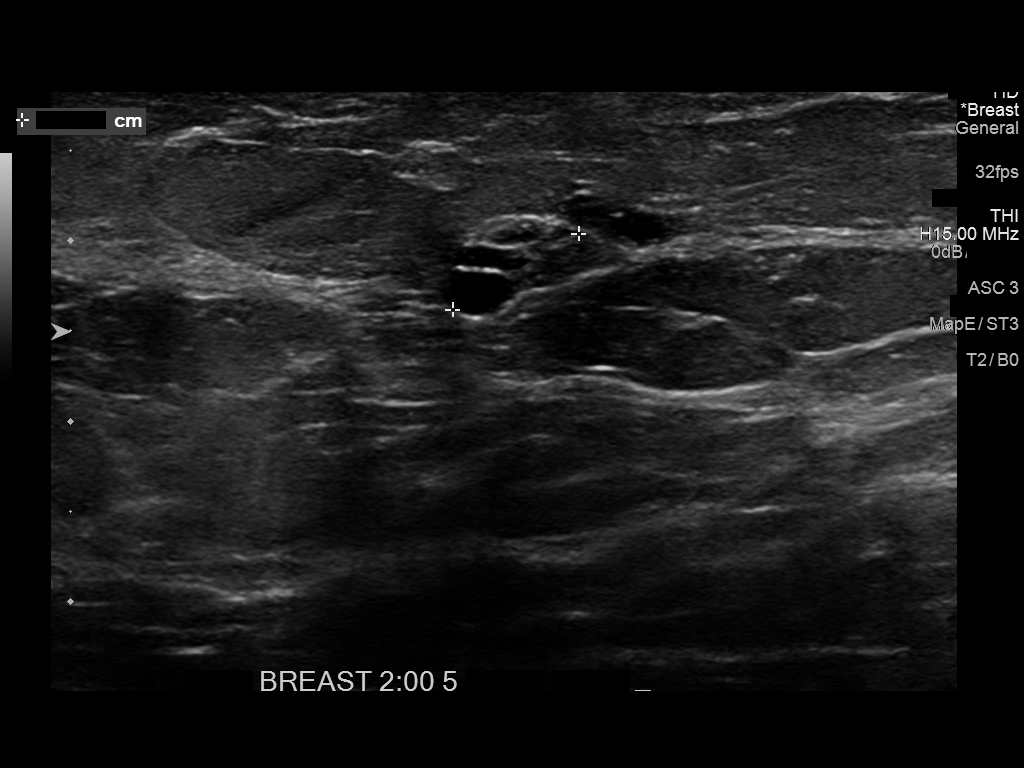
[im 5/6]
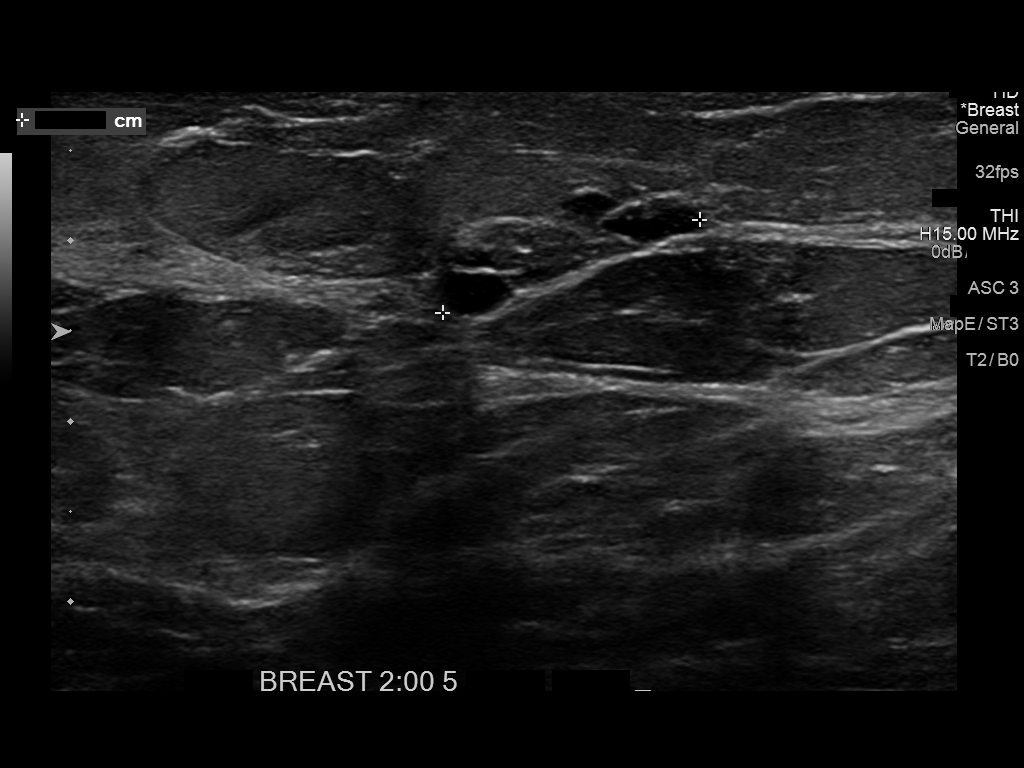
[im 6/6]
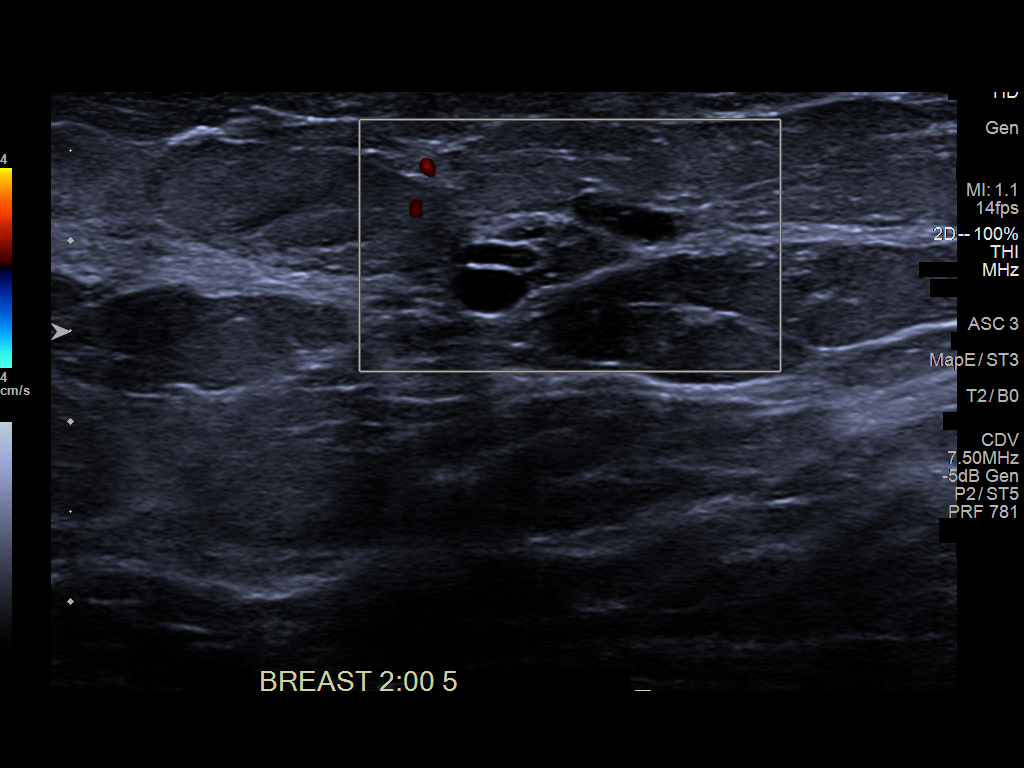

[6 of 6 positions shown; findings below may reference images not displayed]

ACR Breast Density Category b: There are scattered areas of
fibroglandular density.
FINDINGS: Focal spot compression views of the upper outer left breast confirm
a circumscribed gently lobulated oval mass.

Mammographic images were processed with CAD.

On physical exam, no mass is palpated in the upper-outer quadrant of
the left breast.

Targeted ultrasound is performed, showing a cluster of cysts with
thin internal septations at 2 o'clock position 5 cm from the nipple.
In the radial projection this measures 0.6 x 0.6 cm. In the
antiradial projection, a few adjacent cysts in the vicinity are
included, and in total it measures 1.5 cm. No associated vascular
flow is seen.
IMPRESSION: Probably benign cluster of cysts/apical metaplasia in the 2 o'clock
position of the left breast.

RECOMMENDATION:
Left breast ultrasound is suggested in 6 months. The option of
ultrasound-guided biopsy was also discussed with the patient. She
prefers ultrasound follow-up at this time.

I have discussed the findings and recommendations with the patient.
Results were also provided in writing at the conclusion of the
visit. If applicable, a reminder letter will be sent to the patient
regarding the next appointment.

BI-RADS CATEGORY  3: Probably benign.

## 2020-05-27 ENCOUNTER — Encounter: Payer: Self-pay | Admitting: Family Medicine

## 2020-05-27 ENCOUNTER — Ambulatory Visit (INDEPENDENT_AMBULATORY_CARE_PROVIDER_SITE_OTHER): Payer: Managed Care, Other (non HMO) | Admitting: Family Medicine

## 2020-05-27 ENCOUNTER — Other Ambulatory Visit: Payer: Self-pay

## 2020-05-27 VITALS — BP 103/71 | HR 59 | Temp 98.3°F | Wt 145.0 lb

## 2020-05-27 DIAGNOSIS — I700 Atherosclerosis of aorta: Secondary | ICD-10-CM | POA: Diagnosis not present

## 2020-05-27 DIAGNOSIS — E785 Hyperlipidemia, unspecified: Secondary | ICD-10-CM

## 2020-05-27 MED ORDER — EZETIMIBE 10 MG PO TABS: 10.0000 mg | ORAL_TABLET | Freq: Every day | ORAL | 3 refills | Status: DC

## 2020-05-27 MED ORDER — EZETIMIBE 10 MG PO TABS: 10.0000 mg | ORAL_TABLET | Freq: Every day | ORAL | 0 refills | Status: DC

## 2020-05-27 MED ORDER — ATORVASTATIN CALCIUM 40 MG PO TABS: 40.0000 mg | ORAL_TABLET | Freq: Every day | ORAL | 3 refills | Status: DC

## 2020-05-27 MED ORDER — ATORVASTATIN CALCIUM 40 MG PO TABS: 40.0000 mg | ORAL_TABLET | Freq: Every day | ORAL | 0 refills | Status: DC

## 2020-05-27 NOTE — Progress Notes (Signed)
   BP 103/71   Pulse (!) 59   Temp 98.3 F (36.8 C) (Oral)   Wt 145 lb (65.8 kg)   SpO2 98%   BMI 27.40 kg/m    Subjective:    Patient ID: Sara Greer, female    DOB: Apr 14, 1961, 59 y.o.   MRN: [id number]  HPI: Sara Greer is a 59 y.o. female  Chief Complaint  Patient presents with  . Hyperlipidemia    pt states she has been out of medications for over a month   Here today for HLD f/u. Has been off medications for over a month because the mail order pharmacy never sent the medications. Tolerated well prior to this, no claudication, myalgias, CP, SOB. Trying to eat well and stay active.   Relevant past medical, surgical, family and social history reviewed and updated as indicated. Interim medical history since our last visit reviewed. Allergies and medications reviewed and updated.  Review of Systems  Per HPI unless specifically indicated above     Objective:    BP 103/71   Pulse (!) 59   Temp 98.3 F (36.8 C) (Oral)   Wt 145 lb (65.8 kg)   SpO2 98%   BMI 27.40 kg/m   Wt Readings from Last 3 Encounters:  05/27/20 145 lb (65.8 kg)  02/12/20 151 lb (68.5 kg)  12/15/19 150 lb (68 kg)    Physical Exam Vitals and nursing note reviewed.  Constitutional:      Appearance: Normal appearance. She is not ill-appearing.  HENT:     Head: Atraumatic.  Eyes:     Extraocular Movements: Extraocular movements intact.     Conjunctiva/sclera: Conjunctivae normal.  Cardiovascular:     Rate and Rhythm: Normal rate and regular rhythm.     Heart sounds: Normal heart sounds.  Pulmonary:     Effort: Pulmonary effort is normal.     Breath sounds: Normal breath sounds.  Musculoskeletal:        General: Normal range of motion.     Cervical back: Normal range of motion and neck supple.  Skin:    General: Skin is warm and dry.  Neurological:     Mental Status: She is alert and oriented to person, place, and time.  Psychiatric:        Mood and Affect:  Mood normal.        Thought Content: Thought content normal.        Judgment: Judgment normal.     Results for orders placed or performed in visit on 03/23/20  Cologuard  Result Value Ref Range   Cologuard Negative Negative      Assessment & Plan:   Problem List Items Addressed This Visit      Cardiovascular and Mediastinum   Aortic atherosclerosis ([place])   Relevant Medications   atorvastatin (LIPITOR) 40 MG tablet   ezetimibe (ZETIA) 10 MG tablet     Other   Hyperlipemia - Primary    Recheck lipids after restarting medication for at least a month. Future orders placed. Continue diet and exercise changes      Relevant Medications   atorvastatin (LIPITOR) 40 MG tablet   ezetimibe (ZETIA) 10 MG tablet   Other Relevant Orders   Comprehensive metabolic panel   Lipid Panel w/o Chol/HDL Ratio       Follow up plan: Return for March for CPE.

## 2020-05-27 NOTE — Assessment & Plan Note (Signed)
Recheck lipids after restarting medication for at least a month. Future orders placed. Continue diet and exercise changes

## 2020-07-29 ENCOUNTER — Encounter: Payer: Self-pay | Admitting: Family Medicine

## 2020-08-24 ENCOUNTER — Other Ambulatory Visit: Payer: Self-pay

## 2020-08-24 ENCOUNTER — Other Ambulatory Visit: Payer: Managed Care, Other (non HMO)

## 2020-08-24 DIAGNOSIS — Z20822 Contact with and (suspected) exposure to covid-19: Secondary | ICD-10-CM

## 2020-08-25 LAB — SARS-COV-2, NAA 2 DAY TAT

## 2020-08-25 LAB — NOVEL CORONAVIRUS, NAA: SARS-CoV-2, NAA: NOT DETECTED

## 2021-02-15 ENCOUNTER — Encounter: Payer: Managed Care, Other (non HMO) | Admitting: Nurse Practitioner

## 2021-02-15 ENCOUNTER — Encounter: Payer: Managed Care, Other (non HMO) | Admitting: Family Medicine

## 2021-04-06 ENCOUNTER — Other Ambulatory Visit: Payer: Self-pay | Admitting: Family Medicine

## 2021-04-06 NOTE — Telephone Encounter (Signed)
Requested Prescriptions  Pending Prescriptions Disp Refills  . atorvastatin (LIPITOR) 40 MG tablet [Pharmacy Med Name: Atorvastatin Calcium 40 MG Oral Tablet] 90 tablet 0    Sig: TAKE 1 TABLET BY MOUTH  DAILY     Cardiovascular:  Antilipid - Statins Failed - 04/06/2021 11:42 PM      Failed - Total Cholesterol in normal range and within 360 days    Cholesterol, Total  Date Value Ref Range Status  02/12/2020 226 (H) 100 - 199 mg/dL Final         Failed - LDL in normal range and within 360 days    LDL Chol Calc (NIH)  Date Value Ref Range Status  02/12/2020 142 (H) 0 - 99 mg/dL Final         Failed - HDL in normal range and within 360 days    HDL  Date Value Ref Range Status  02/12/2020 66 >39 mg/dL Final         Failed - Triglycerides in normal range and within 360 days    Triglycerides  Date Value Ref Range Status  02/12/2020 100 0 - 149 mg/dL Final         Passed - Patient is not pregnant      Passed - Valid encounter within last 12 months    Recent Outpatient Visits          10 months ago Hyperlipidemia, unspecified hyperlipidemia type   [facility] Volney American, PA-C   1 year ago Hyperlipidemia, unspecified hyperlipidemia type   [facility], Lilia Argue, [place]   1 year ago Genital sore   [facility] Merrie Roof [place], [place]   2 years ago Hyperlipidemia, unspecified hyperlipidemia type   [facility] Merrie Roof [place], [place]   3 years ago Immunity status testing   [place], [place], [place]             . ezetimibe (ZETIA) 10 MG tablet [Pharmacy Med Name: Ezetimibe 10 MG Oral Tablet] 90 tablet 0    Sig: TAKE 1 TABLET BY MOUTH  DAILY     Cardiovascular:  Antilipid - Sterol Transport Inhibitors Failed - 04/06/2021 11:42 PM      Failed - Total Cholesterol in normal range and within 360 days    Cholesterol, Total  Date Value Ref Range Status  02/12/2020 226  (H) 100 - 199 mg/dL Final         Failed - LDL in normal range and within 360 days    LDL Chol Calc (NIH)  Date Value Ref Range Status  02/12/2020 142 (H) 0 - 99 mg/dL Final         Failed - HDL in normal range and within 360 days    HDL  Date Value Ref Range Status  02/12/2020 66 >39 mg/dL Final         Failed - Triglycerides in normal range and within 360 days    Triglycerides  Date Value Ref Range Status  02/12/2020 100 0 - 149 mg/dL Final         Passed - Valid encounter within last 12 months    Recent Outpatient Visits          10 months ago Hyperlipidemia, unspecified hyperlipidemia type   [facility] Volney American, PA-C   1 year ago Hyperlipidemia, unspecified hyperlipidemia type   [facility], [place], [place]   1 year ago Genital sore  [place], [employer], [place]   2 years ago Hyperlipidemia, unspecified hyperlipidemia type   [facility] Merrie Roof [place], [place]   3 years ago Immunity status testing   [facility] Merrie Roof [place], [place]

## 2021-06-15 NOTE — Progress Notes (Signed)
BP 130/73   Pulse (!) 59   Temp 98.8 F (37.1 C)   Wt 150 lb 2 oz (68.1 kg)   SpO2 98%   BMI 28.37 kg/m    Subjective:    Patient ID: Sara Greer, female    DOB: 1961-08-25, 60 y.o.   MRN: [id number]  HPI: Sara Greer is a 60 y.o. female  Chief Complaint  Patient presents with   Work Related Injury    Herniated disc, has not been able to get in touch with her case worker, so she decided to come have a follow up with [place]   HYPERLIPIDEMIA Hyperlipidemia status: excellent compliance Satisfied with current treatment?  yes Side effects:  no Medication compliance: excellent compliance Past cholesterol meds: atorvastain (lipitor) and ezetimide (zetia) Supplements: none Aspirin:  no The 10-year ASCVD risk score Mikey Bussing DC Jr., et al., 2013) is: 4%   Values used to calculate the score:     Age: 75 years     Sex: Female     Is Non-Hispanic African American: Yes     Diabetic: No     Tobacco smoker: No     Systolic Blood Pressure: 878 mmHg     Is BP treated: No     HDL Cholesterol: 66 mg/dL     Total Cholesterol: 226 mg/dL Chest pain:  no Coronary artery disease:  no Family history CAD:  no Family history early CAD:  no  Patient states she had a work related injury where she had a herniated disc. She hasn't been able to get in touch with her case worker and would like to follow up here. [place]. Since then patient has had back pain.  Patient states the pain is sharp and shooting pain.  Patient has not seen a orthopedic doctor.    Denies HA, CP, SOB, dizziness, palpitations, visual changes, and lower extremity swelling.  Relevant past medical, surgical, family and social history reviewed and updated as indicated. Interim medical history since our last visit reviewed. Allergies and medications reviewed and updated.  Review of Systems  Eyes:  Negative for visual disturbance.  Respiratory:   Negative for cough, chest tightness and shortness of breath.   Cardiovascular:  Negative for chest pain, palpitations and leg swelling.  Musculoskeletal:  Positive for back pain.  Neurological:  Negative for dizziness and headaches.   Per HPI unless specifically indicated above     Objective:    BP 130/73   Pulse (!) 59   Temp 98.8 F (37.1 C)   Wt 150 lb 2 oz (68.1 kg)   SpO2 98%   BMI 28.37 kg/m   Wt Readings from Last 3 Encounters:  06/16/21 150 lb 2 oz (68.1 kg)  05/27/20 145 lb (65.8 kg)  02/12/20 151 lb (68.5 kg)    Physical Exam Vitals and nursing note reviewed.  Constitutional:      General: She is not in acute distress.    Appearance: Normal appearance. She is normal weight. She is not ill-appearing, toxic-appearing or diaphoretic.  HENT:     Head: Normocephalic.     Right Ear: External ear normal.     Left Ear: External ear normal.     Nose: Nose normal.     Mouth/Throat:     Mouth: Mucous membranes are moist.     Pharynx: Oropharynx is clear.  Eyes:     General:  Right eye: No discharge.        Left eye: No discharge.     Extraocular Movements: Extraocular movements intact.     Conjunctiva/sclera: Conjunctivae normal.     Pupils: Pupils are equal, round, and reactive to light.  Cardiovascular:     Rate and Rhythm: Normal rate and regular rhythm.     Heart sounds: No murmur heard. Pulmonary:     Effort: Pulmonary effort is normal. No respiratory distress.     Breath sounds: Normal breath sounds. No wheezing or rales.  Musculoskeletal:     Cervical back: Normal range of motion and neck supple.  Skin:    General: Skin is warm and dry.     Capillary Refill: Capillary refill takes less than 2 seconds.  Neurological:     General: No focal deficit present.     Mental Status: She is alert and oriented to person, place, and time. Mental status is at baseline.  Psychiatric:        Mood and Affect: Mood normal.        Behavior: Behavior normal.         Thought Content: Thought content normal.        Judgment: Judgment normal.    Results for orders placed or performed in visit on 08/24/20  Novel Coronavirus, NAA ([facility])   Specimen: Nasopharyngeal(NP) swabs in vial transport medium   Nasopharynge  Screenin  Result Value Ref Range   SARS-CoV-2, NAA Not Detected Not Detected  SARS-COV-2, NAA 2 DAY TAT   Nasopharynge  Screenin  Result Value Ref Range   SARS-CoV-2, NAA 2 DAY TAT Performed       Assessment & Plan:   Problem List Items Addressed This Visit       Cardiovascular and Mediastinum   Aortic atherosclerosis ([place]) - Primary    Chronic.  Controlled.  Continue with current medication regimen.  Labs ordered today.  Refills sent today.  Return to clinic in 6 months for reevaluation.  Call sooner if concerns arise.         Relevant Medications   ezetimibe (ZETIA) 10 MG tablet   atorvastatin (LIPITOR) 40 MG tablet   Other Relevant Orders   Comp Met (CMET)   Lipid Profile     Other   Hyperlipemia    Chronic.  Controlled.  Continue with current medication regimen.  Labs ordered today.  Refills sent.  Return to clinic in 6 months for reevaluation.  Call sooner if concerns arise.         Relevant Medications   ezetimibe (ZETIA) 10 MG tablet   atorvastatin (LIPITOR) 40 MG tablet   Other Relevant Orders   Comp Met (CMET)   Lipid Profile   Other Visit Diagnoses     Acute right-sided low back pain with left-sided sciatica       Referral placed for patient to see Orthopedics for further evaluation and treatment.   Relevant Orders   Ambulatory referral to Orthopedics        Follow up plan: Return in about 6 months (around 12/17/2021) for Physical and Fasting labs.

## 2021-06-16 ENCOUNTER — Encounter: Payer: Self-pay | Admitting: Nurse Practitioner

## 2021-06-16 ENCOUNTER — Ambulatory Visit (INDEPENDENT_AMBULATORY_CARE_PROVIDER_SITE_OTHER): Payer: Managed Care, Other (non HMO) | Admitting: Nurse Practitioner

## 2021-06-16 ENCOUNTER — Other Ambulatory Visit: Payer: Self-pay

## 2021-06-16 VITALS — BP 130/73 | HR 59 | Temp 98.8°F | Wt 150.1 lb

## 2021-06-16 DIAGNOSIS — I700 Atherosclerosis of aorta: Secondary | ICD-10-CM

## 2021-06-16 DIAGNOSIS — E785 Hyperlipidemia, unspecified: Secondary | ICD-10-CM | POA: Diagnosis not present

## 2021-06-16 DIAGNOSIS — M5442 Lumbago with sciatica, left side: Secondary | ICD-10-CM | POA: Diagnosis not present

## 2021-06-16 MED ORDER — EZETIMIBE 10 MG PO TABS: 10.0000 mg | ORAL_TABLET | Freq: Every day | ORAL | 1 refills | Status: DC

## 2021-06-16 MED ORDER — ATORVASTATIN CALCIUM 40 MG PO TABS: 40.0000 mg | ORAL_TABLET | Freq: Every day | ORAL | 1 refills | Status: DC

## 2021-06-16 NOTE — Assessment & Plan Note (Signed)
Chronic.  Controlled.  Continue with current medication regimen.  Labs ordered today.  Refills sent today.  Return to clinic in 6 months for reevaluation.  Call sooner if concerns arise.   

## 2021-06-16 NOTE — Assessment & Plan Note (Signed)
Chronic.  Controlled.  Continue with current medication regimen.  Labs ordered today.  Refills sent.  Return to clinic in 6 months for reevaluation.  Call sooner if concerns arise.  

## 2021-06-17 LAB — COMPREHENSIVE METABOLIC PANEL
ALT: 22 IU/L (ref 0–32)
AST: 24 IU/L (ref 0–40)
Albumin/Globulin Ratio: 0.9 — ABNORMAL LOW (ref 1.2–2.2)
Albumin: 3.7 g/dL — ABNORMAL LOW (ref 3.8–4.9)
Alkaline Phosphatase: 62 IU/L (ref 44–121)
BUN/Creatinine Ratio: 19 (ref 9–23)
BUN: 13 mg/dL (ref 6–24)
Bilirubin Total: 0.3 mg/dL (ref 0.0–1.2)
CO2: 24 mmol/L (ref 20–29)
Calcium: 9.2 mg/dL (ref 8.7–10.2)
Chloride: 105 mmol/L (ref 96–106)
Creatinine, Ser: 0.69 mg/dL (ref 0.57–1.00)
Globulin, Total: 4 g/dL (ref 1.5–4.5)
Glucose: 78 mg/dL (ref 65–99)
Potassium: 4.6 mmol/L (ref 3.5–5.2)
Sodium: 141 mmol/L (ref 134–144)
Total Protein: 7.7 g/dL (ref 6.0–8.5)
eGFR: 101 mL/min/{1.73_m2} (ref >59)

## 2021-06-17 LAB — LIPID PANEL
Chol/HDL Ratio: 3.5 ratio (ref 0.0–4.4)
Cholesterol, Total: 241 mg/dL — ABNORMAL HIGH (ref 100–199)
HDL: 68 mg/dL (ref >39)
LDL Chol Calc (NIH): 161 mg/dL — ABNORMAL HIGH (ref 0–99)
Triglycerides: 72 mg/dL (ref 0–149)
VLDL Cholesterol Cal: 12 mg/dL (ref 5–40)

## 2021-06-17 NOTE — Progress Notes (Signed)
Hi Sara Greer, it was nice to meet you yesterday. Your cholesterol is elevated from prior.  I recommend continuing your medication regimen and follow a low fat diet.  Once your back is feeling better you would benefit from exercising.  Please let me know if you have any questions.

## 2021-06-24 ENCOUNTER — Encounter: Payer: Managed Care, Other (non HMO) | Admitting: Nurse Practitioner

## 2021-08-05 HISTORY — PX: SPINE SURGERY: SHX786

## 2021-09-17 ENCOUNTER — Ambulatory Visit (INDEPENDENT_AMBULATORY_CARE_PROVIDER_SITE_OTHER): Payer: Managed Care, Other (non HMO) | Admitting: Nurse Practitioner

## 2021-09-17 ENCOUNTER — Other Ambulatory Visit: Payer: Self-pay

## 2021-09-17 ENCOUNTER — Encounter: Payer: Self-pay | Admitting: Nurse Practitioner

## 2021-09-17 VITALS — BP 102/66 | HR 73 | Ht 61.0 in | Wt 150.0 lb

## 2021-09-17 DIAGNOSIS — M549 Dorsalgia, unspecified: Secondary | ICD-10-CM

## 2021-09-17 DIAGNOSIS — G8929 Other chronic pain: Secondary | ICD-10-CM | POA: Diagnosis not present

## 2021-09-17 DIAGNOSIS — E785 Hyperlipidemia, unspecified: Secondary | ICD-10-CM | POA: Diagnosis not present

## 2021-09-17 DIAGNOSIS — I700 Atherosclerosis of aorta: Secondary | ICD-10-CM | POA: Diagnosis not present

## 2021-09-17 MED ORDER — GABAPENTIN 100 MG PO CAPS: 100.0000 mg | ORAL_CAPSULE | Freq: Three times a day (TID) | ORAL | 1 refills | Status: DC

## 2021-09-17 MED ORDER — EZETIMIBE 10 MG PO TABS: 10.0000 mg | ORAL_TABLET | Freq: Every day | ORAL | 1 refills | Status: DC

## 2021-09-17 MED ORDER — ATORVASTATIN CALCIUM 40 MG PO TABS: 40.0000 mg | ORAL_TABLET | Freq: Every day | ORAL | 1 refills | Status: DC

## 2021-09-17 NOTE — Assessment & Plan Note (Signed)
Chronic.  Controlled.  Continue with current medication regimen of Zetia 10mg  and Atorvastatin 40mg .  Labs ordered today.  Return to clinic in 6 months for reevaluation.  Call sooner if concerns arise.

## 2021-09-17 NOTE — Progress Notes (Signed)
BP 102/66   Pulse 73   Ht _0  (1.549 m)   Wt 150 lb (68 kg)   BMI 28.34 kg/m    Subjective:    Patient ID: Sara Greer, female    DOB: 09-27-1961, 60 y.o.   MRN: [id number]  HPI: Sara Greer is a 60 y.o. female  Chief Complaint  Patient presents with   Medication Management   HYPERLIPIDEMIA Hyperlipidemia status: excellent compliance Satisfied with current treatment?  yes Side effects:  no Medication compliance: excellent compliance Past cholesterol meds: atorvastain (lipitor) and ezetimide (zetia) Supplements: none Aspirin:  no The 10-year ASCVD risk score (Arnett DK, et al., 2019) is: 1.9%   Values used to calculate the score:     Age: 34 years     Sex: Female     Is Non-Hispanic African American: Yes     Diabetic: No     Tobacco smoker: No     Systolic Blood Pressure: 409 mmHg     Is BP treated: No     HDL Cholesterol: 68 mg/dL     Total Cholesterol: 241 mg/dL Chest pain:  no Coronary artery disease:  no Family history CAD:  no Family history early CAD:  no  Patient states she was at [facility] this morning following up after her back surgery and they told her she needs to follow up and get her medications from her pharmacy. She will need Gabapentin.  Relevant past medical, surgical, family and social history reviewed and updated as indicated. Interim medical history since our last visit reviewed. Allergies and medications reviewed and updated.  Review of Systems  Eyes:  Negative for visual disturbance.  Respiratory:  Negative for cough, chest tightness and shortness of breath.   Cardiovascular:  Negative for chest pain, palpitations and leg swelling.  Musculoskeletal:  Positive for back pain.  Neurological:  Negative for dizziness and headaches.   Per HPI unless specifically indicated above     Objective:    BP 102/66   Pulse 73   Ht _1  (1.549 m)   Wt 150 lb (68 kg)   BMI 28.34 kg/m   Wt Readings from Last 3  Encounters:  09/17/21 150 lb (68 kg)  06/16/21 150 lb 2 oz (68.1 kg)  05/27/20 145 lb (65.8 kg)    Physical Exam Vitals and nursing note reviewed.  Constitutional:      General: She is not in acute distress.    Appearance: Normal appearance. She is normal weight. She is not ill-appearing, toxic-appearing or diaphoretic.  HENT:     Head: Normocephalic.     Right Ear: External ear normal.     Left Ear: External ear normal.     Nose: Nose normal.     Mouth/Throat:     Mouth: Mucous membranes are moist.     Pharynx: Oropharynx is clear.  Eyes:     General:        Right eye: No discharge.        Left eye: No discharge.     Extraocular Movements: Extraocular movements intact.     Conjunctiva/sclera: Conjunctivae normal.     Pupils: Pupils are equal, round, and reactive to light.  Cardiovascular:     Rate and Rhythm: Normal rate and regular rhythm.     Heart sounds: No murmur heard. Pulmonary:     Effort: Pulmonary effort is normal. No respiratory distress.     Breath sounds: Normal breath sounds. No wheezing or rales.  Musculoskeletal:     Cervical back: Normal range of motion and neck supple.     Comments: Back brace  Skin:    General: Skin is warm and dry.     Capillary Refill: Capillary refill takes less than 2 seconds.  Neurological:     General: No focal deficit present.     Mental Status: She is alert and oriented to person, place, and time. Mental status is at baseline.  Psychiatric:        Mood and Affect: Mood normal.        Behavior: Behavior normal.        Thought Content: Thought content normal.        Judgment: Judgment normal.    Results for orders placed or performed in visit on 06/16/21  Comp Met (CMET)  Result Value Ref Range   Glucose 78 65 - 99 mg/dL   BUN 13 6 - 24 mg/dL   Creatinine, Ser 0.69 0.57 - 1.00 mg/dL   eGFR 101 >59 mL/min/1.73   BUN/Creatinine Ratio 19 9 - 23   Sodium 141 134 - 144 mmol/L   Potassium 4.6 3.5 - 5.2 mmol/L   Chloride 105  96 - 106 mmol/L   CO2 24 20 - 29 mmol/L   Calcium 9.2 8.7 - 10.2 mg/dL   Total Protein 7.7 6.0 - 8.5 g/dL   Albumin 3.7 (L) 3.8 - 4.9 g/dL   Globulin, Total 4.0 1.5 - 4.5 g/dL   Albumin/Globulin Ratio 0.9 (L) 1.2 - 2.2   Bilirubin Total 0.3 0.0 - 1.2 mg/dL   Alkaline Phosphatase 62 44 - 121 IU/L   AST 24 0 - 40 IU/L   ALT 22 0 - 32 IU/L  Lipid Profile  Result Value Ref Range   Cholesterol, Total 241 (H) 100 - 199 mg/dL   Triglycerides 72 0 - 149 mg/dL   HDL 68 >39 mg/dL   VLDL Cholesterol Cal 12 5 - 40 mg/dL   LDL Chol Calc (NIH) 161 (H) 0 - 99 mg/dL   Chol/HDL Ratio 3.5 0.0 - 4.4 ratio      Assessment & Plan:   Problem List Items Addressed This Visit       Cardiovascular and Mediastinum   Aortic atherosclerosis ([place]) - Primary    Chronic.  Controlled.  Continue with current medication regimen of Zetia $Remove'10mg'oWdpabL$  and Atorvastatin $RemoveBeforeDE'40mg'[id number]$ .  Labs ordered today.  Return to clinic in 6 months for reevaluation.  Call sooner if concerns arise.        Relevant Medications   ezetimibe (ZETIA) 10 MG tablet   atorvastatin (LIPITOR) 40 MG tablet   Other Relevant Orders   Lipid Profile     Other   Hyperlipemia    Chronic.  Controlled.  Continue with current medication regimen of Zetia $Remove'10mg'PHMJswH$  and Atorvastatin $RemoveBeforeDE'40mg'[id number]$ .  Labs ordered today.  Return to clinic in 6 months for reevaluation.  Call sooner if concerns arise.        Relevant Medications   ezetimibe (ZETIA) 10 MG tablet   atorvastatin (LIPITOR) 40 MG tablet   Other Relevant Orders   Lipid Profile   Other Visit Diagnoses     Chronic back pain, unspecified back location, unspecified back pain laterality       Gabapentin prescribed for patient's ongoing back pain due to surgery.   Relevant Medications   gabapentin (NEURONTIN) 100 MG capsule        Follow up plan: Return in about 6 months (around 03/18/2022) for  Physical and Fasting labs.

## 2021-09-18 LAB — LIPID PANEL
Chol/HDL Ratio: 2.6 ratio (ref 0.0–4.4)
Cholesterol, Total: 168 mg/dL (ref 100–199)
HDL: 64 mg/dL (ref >39)
LDL Chol Calc (NIH): 88 mg/dL (ref 0–99)
Triglycerides: 86 mg/dL (ref 0–149)
VLDL Cholesterol Cal: 16 mg/dL (ref 5–40)

## 2021-09-20 NOTE — Progress Notes (Signed)
Hi Nickey.  Your cholesterol has improved from prior.  Keep up the great work.  Let me know if you have any questions.

## 2021-11-26 ENCOUNTER — Other Ambulatory Visit: Payer: Self-pay | Admitting: Nurse Practitioner

## 2021-11-26 NOTE — Telephone Encounter (Signed)
Medication Refill - Medication: atorvastatin (LIPITOR) 40 MG tablet and ezetimibe (ZETIA) 10 MG tablet  Has the patient contacted their pharmacy? Yes.    (Agent: If yes, when and what did the pharmacy advise?) Contact PCP office because the pharmacy has changed.    Preferred Pharmacy (with phone number or street name):   [facility], [address] [facility] [address] Phone:  [phone]  Fax:  [phone]      Has the patient been seen for an appointment in the last year OR does the patient have an upcoming appointment? Yes.    Agent: Please be advised that RX refills may take up to 3 business days. We ask that you follow-up with your pharmacy.atorvastatin (LIPITOR) 40 MG tablet

## 2021-11-27 MED ORDER — EZETIMIBE 10 MG PO TABS: 10.0000 mg | ORAL_TABLET | Freq: Every day | ORAL | 0 refills | Status: DC

## 2021-11-27 MED ORDER — ATORVASTATIN CALCIUM 40 MG PO TABS: 40.0000 mg | ORAL_TABLET | Freq: Every day | ORAL | 0 refills | Status: DC

## 2021-11-27 NOTE — Telephone Encounter (Signed)
Requested Prescriptions  Pending Prescriptions Disp Refills   ezetimibe (ZETIA) 10 MG tablet 90 tablet 0    Sig: Take 1 tablet (10 mg total) by mouth daily.     Cardiovascular:  Antilipid - Sterol Transport Inhibitors Passed - 11/27/2021  7:23 AM      Passed - Total Cholesterol in normal range and within 360 days    Cholesterol, Total  Date Value Ref Range Status  09/17/2021 168 100 - 199 mg/dL Final         Passed - LDL in normal range and within 360 days    LDL Chol Calc (NIH)  Date Value Ref Range Status  09/17/2021 88 0 - 99 mg/dL Final         Passed - HDL in normal range and within 360 days    HDL  Date Value Ref Range Status  09/17/2021 64 >39 mg/dL Final         Passed - Triglycerides in normal range and within 360 days    Triglycerides  Date Value Ref Range Status  09/17/2021 86 0 - 149 mg/dL Final         Passed - Valid encounter within last 12 months    Recent Outpatient Visits          2 months ago Aortic atherosclerosis ([place])   [facility] Jon Billings, NP   5 months ago Aortic atherosclerosis ([place])   [facility] Jon Billings, NP   1 year ago Hyperlipidemia, unspecified hyperlipidemia type   [facility], [place], [place]   1 year ago Hyperlipidemia, unspecified hyperlipidemia type   [facility], [place], [place]   2 years ago Genital sore   [place], [place], [place]      Future Appointments            In 3 months Jon Billings, NP [facility], [facility]            atorvastatin (LIPITOR) 40 MG tablet 90 tablet 0    Sig: Take 1 tablet (40 mg total) by mouth daily.     Cardiovascular:  Antilipid - Statins Passed - 11/27/2021  7:23 AM      Passed - Total Cholesterol in normal range and within 360 days    Cholesterol, Total  Date Value Ref Range Status  09/17/2021 168 100 - 199 mg/dL Final         Passed - LDL in normal  range and within 360 days    LDL Chol Calc (NIH)  Date Value Ref Range Status  09/17/2021 88 0 - 99 mg/dL Final         Passed - HDL in normal range and within 360 days    HDL  Date Value Ref Range Status  09/17/2021 64 >39 mg/dL Final         Passed - Triglycerides in normal range and within 360 days    Triglycerides  Date Value Ref Range Status  09/17/2021 86 0 - 149 mg/dL Final         Passed - Patient is not pregnant      Passed - Valid encounter within last 12 months    Recent Outpatient Visits          2 months ago Aortic atherosclerosis ([place])   [facility] Jon Billings, NP   5 months ago Aortic atherosclerosis ([place])   [facility] Jon Billings, NP   1 year  ago Hyperlipidemia, unspecified hyperlipidemia type   [place], [place], [place]   1 year ago Hyperlipidemia, unspecified hyperlipidemia type   [facility] Merrie Roof [place], [place]   2 years ago Genital sore   [facility] Volney [place], [place]      Future Appointments            In 3 months Jon Billings, NP [facility], [place]

## 2021-12-17 ENCOUNTER — Encounter: Payer: Managed Care, Other (non HMO) | Admitting: Nurse Practitioner

## 2022-01-06 ENCOUNTER — Encounter: Payer: Self-pay | Admitting: Nurse Practitioner

## 2022-02-15 ENCOUNTER — Telehealth: Payer: Self-pay | Admitting: Nurse Practitioner

## 2022-02-15 NOTE — Telephone Encounter (Signed)
Routing to provider to advise. I do not see a colonoscopy in the patient's chart. Cologuard was completed in 2021 with negative results. Next one due next year. No result note on mammogram. Normal results per report. OK to give this information to the patient? ?

## 2022-02-15 NOTE — Telephone Encounter (Signed)
Copied from [place] [id number]. Topic: General - Other ?>> Feb 15, 2022  9:36 AM Holley Dexter N wrote: ?Reason for CRM: Pt called in wanting to get Mammo results she had done on 08/22/19, and she also wanted to see about when her last colonoscopy was, and get those results as well, please advise. ?

## 2022-02-16 DIAGNOSIS — Z01818 Encounter for other preprocedural examination: Secondary | ICD-10-CM | POA: Insufficient documentation

## 2022-02-16 DIAGNOSIS — C9000 Multiple myeloma not having achieved remission: Secondary | ICD-10-CM

## 2022-02-16 HISTORY — DX: Multiple myeloma not having achieved remission: C90.00

## 2022-02-16 NOTE — Telephone Encounter (Signed)
Called and gave results to the patient. Patient requested a copy of her mammogram be mailed to her.  ? ?Results printed and mailed to the patient.  ?

## 2022-02-23 ENCOUNTER — Other Ambulatory Visit: Payer: Self-pay | Admitting: Nurse Practitioner

## 2022-02-23 NOTE — Telephone Encounter (Signed)
Requested Prescriptions  ?Pending Prescriptions Disp Refills  ?? ezetimibe (ZETIA) 10 MG tablet [Pharmacy Med Name: EZETIMIBE '10MG'$  TABLETS] 90 tablet 0  ?  Sig: TAKE 1 TABLET(10 MG) BY MOUTH DAILY  ?  ? Cardiovascular:  Antilipid - Sterol Transport Inhibitors Failed - 02/23/2022  3:18 AM  ?  ?  Failed - Lipid Panel in normal range within the last 12 months  ?  Cholesterol, Total  ?Date Value Ref Range Status  ?09/17/2021 168 100 - 199 mg/dL Final  ? ?LDL Chol Calc (NIH)  ?Date Value Ref Range Status  ?09/17/2021 88 0 - 99 mg/dL Final  ? ?HDL  ?Date Value Ref Range Status  ?09/17/2021 64 >39 mg/dL Final  ? ?Triglycerides  ?Date Value Ref Range Status  ?09/17/2021 86 0 - 149 mg/dL Final  ? ?  ?  ?  Passed - AST in normal range and within 360 days  ?  AST  ?Date Value Ref Range Status  ?06/16/2021 24 0 - 40 IU/L Final  ?   ?  ?  Passed - ALT in normal range and within 360 days  ?  ALT  ?Date Value Ref Range Status  ?06/16/2021 22 0 - 32 IU/L Final  ?   ?  ?  Passed - Patient is not pregnant  ?  ?  Passed - Valid encounter within last 12 months  ?  Recent Outpatient Visits   ?      ? 5 months ago Aortic atherosclerosis ([place])  ? [place], NP  ? 8 months ago Aortic atherosclerosis ([place])  ? [place], NP  ? 1 year ago Hyperlipidemia, unspecified hyperlipidemia type  ? [place], [place]  ? 2 years ago Hyperlipidemia, unspecified hyperlipidemia type  ? [place], [place]  ? 2 years ago Genital sore  ? [place], [place], [place]  ?  ?  ?Future Appointments   ?        ? In 1 month Jon Billings, NP [facility], [facility]  ?  ? ?  ?  ?  ?? atorvastatin (LIPITOR) 40 MG tablet [Pharmacy Med Name: ATORVASTATIN '40MG'$  TABLETS] 90 tablet 0  ?  Sig: TAKE 1 TABLET(40 MG) BY MOUTH DAILY  ?  ? Cardiovascular:  Antilipid - Statins Failed - 02/23/2022  3:18 AM  ?  ?   Failed - Lipid Panel in normal range within the last 12 months  ?  Cholesterol, Total  ?Date Value Ref Range Status  ?09/17/2021 168 100 - 199 mg/dL Final  ? ?LDL Chol Calc (NIH)  ?Date Value Ref Range Status  ?09/17/2021 88 0 - 99 mg/dL Final  ? ?HDL  ?Date Value Ref Range Status  ?09/17/2021 64 >39 mg/dL Final  ? ?Triglycerides  ?Date Value Ref Range Status  ?09/17/2021 86 0 - 149 mg/dL Final  ? ?  ?  ?  Passed - Patient is not pregnant  ?  ?  Passed - Valid encounter within last 12 months  ?  Recent Outpatient Visits   ?      ? 5 months ago Aortic atherosclerosis ([place])  ? [place], NP  ? 8 months ago Aortic atherosclerosis ([place])  ? [place], NP  ? 1 year ago Hyperlipidemia, unspecified hyperlipidemia type  ? [place], [place], [place]  ? 2 years ago Hyperlipidemia, unspecified  hyperlipidemia type  ? [place], [place]  ? 2 years ago Genital sore  ? [place], [place], [place]  ?  ?  ?Future Appointments   ?        ? In 1 month Jon Billings, NP [facility], [facility]  ?  ? ?  ?  ?  ? ?

## 2022-03-04 ENCOUNTER — Encounter: Payer: Self-pay | Admitting: Nurse Practitioner

## 2022-03-04 ENCOUNTER — Other Ambulatory Visit: Payer: Self-pay

## 2022-03-04 ENCOUNTER — Ambulatory Visit (INDEPENDENT_AMBULATORY_CARE_PROVIDER_SITE_OTHER): Payer: Managed Care, Other (non HMO) | Admitting: Nurse Practitioner

## 2022-03-04 VITALS — BP 127/78 | HR 80 | Temp 99.2°F | Ht 60.9 in | Wt 145.2 lb

## 2022-03-04 DIAGNOSIS — R8290 Unspecified abnormal findings in urine: Secondary | ICD-10-CM

## 2022-03-04 DIAGNOSIS — I700 Atherosclerosis of aorta: Secondary | ICD-10-CM

## 2022-03-04 DIAGNOSIS — C9000 Multiple myeloma not having achieved remission: Secondary | ICD-10-CM

## 2022-03-04 DIAGNOSIS — E785 Hyperlipidemia, unspecified: Secondary | ICD-10-CM | POA: Diagnosis not present

## 2022-03-04 DIAGNOSIS — Z124 Encounter for screening for malignant neoplasm of cervix: Secondary | ICD-10-CM

## 2022-03-04 DIAGNOSIS — Z0000 Encounter for general adult medical examination without abnormal findings: Secondary | ICD-10-CM | POA: Diagnosis not present

## 2022-03-04 DIAGNOSIS — Z1231 Encounter for screening mammogram for malignant neoplasm of breast: Secondary | ICD-10-CM

## 2022-03-04 NOTE — Assessment & Plan Note (Signed)
Chronic.  Controlled.  Continue with current medication regimen of Zetia '10mg'$  and Atorvastatin '40mg'$ .  Labs ordered today.  Return to clinic in 6 months for reevaluation.  Call sooner if concerns arise.  ? ?

## 2022-03-04 NOTE — Progress Notes (Signed)
? ?BP 127/78   Pulse 80   Temp 99.2 ?F (37.3 ?C) (Oral)   Ht 5' 0.9" (1.547 m)   Wt 145 lb 3.2 oz (65.9 kg)   SpO2 98%   BMI 27.53 kg/m?   ? ?Subjective:  ? ? Patient ID: Sara Greer, female    DOB: 02/11/1961, 61 y.o.   MRN: [id number] ? ?HPI: ?Sara Greer is a 61 y.o. female presenting on 03/04/2022 for comprehensive medical examination. Current medical complaints include:none ? ?She currently lives with: ?Menopausal Symptoms: no ? ?HYPERLIPIDEMIA ?Hyperlipidemia status: excellent compliance ?Satisfied with current treatment?  no ?Side effects:  no ?Medication compliance: excellent compliance ?Past cholesterol meds: atorvastain (lipitor) ?Supplements: none ?Aspirin:  no ?The 10-year ASCVD risk score (Arnett DK, et al., 2019) is: 3.2% ?  Values used to calculate the score: ?    Age: 60 years ?    Sex: Female ?    Is Non-Hispanic African American: Yes ?    Diabetic: No ?    Tobacco smoker: No ?    Systolic Blood Pressure: 624 mmHg ?    Is BP treated: No ?    HDL Cholesterol: 64 mg/dL ?    Total Cholesterol: 168 mg/dL ?Chest pain:  no ?Coronary artery disease:  no ?Family history CAD:  no ?Family history early CAD:  no ? ?Depression Screen done today and results listed below:  ? ?  03/04/2022  ?  4:02 PM 06/16/2021  ?  8:52 AM 02/12/2020  ?  9:03 AM 07/12/2017  ? 10:09 AM  ?Depression screen PHQ 2/9  ?Decreased Interest 0 0 0 1  ?Down, Depressed, Hopeless 0 1 0 0  ?PHQ - 2 Score 0 1 0 1  ?Altered sleeping 3  0 2  ?Tired, decreased energy 1  0 1  ?Change in appetite 0  0 1  ?Feeling bad or failure about yourself  0  0 0  ?Trouble concentrating 0  0 0  ?Moving slowly or fidgety/restless 0  0 0  ?Suicidal thoughts 0  0 0  ?PHQ-9 Score 4  0 5  ?Difficult doing work/chores Not difficult at all     ? ? ?The patient does not have a history of falls. I did complete a risk assessment for falls. A plan of care for falls was documented. ? ? ?Past Medical History:  ?Past Medical History:  ?Diagnosis  Date  ? Hyperlipemia   ? IgG multiple myeloma ([place]) 02/16/2022  ? ? ?Surgical History:  ?Past Surgical History:  ?Procedure Laterality Date  ? BREAST BIOPSY Left 05/24/2018  ? neg/ bx/clip/u/s  ? CESAREAN SECTION    ? MYOMECTOMY    ? OOPHORECTOMY    ? SPINE SURGERY  08/05/2021  ? TOTAL VAGINAL HYSTERECTOMY    ? TUBAL LIGATION    ? ? ?Medications:  ?Current Outpatient Medications on File Prior to Visit  ?Medication Sig  ? acetaminophen (TYLENOL) 500 MG tablet Take 500 mg by mouth every 6 (six) hours as needed.  ? acyclovir (ZOVIRAX) 400 MG tablet Take 400 mg by mouth 2 (two) times daily.  ? aspirin 81 MG chewable tablet Chew by mouth daily.  ? atorvastatin (LIPITOR) 40 MG tablet TAKE 1 TABLET(40 MG) BY MOUTH DAILY  ? ezetimibe (ZETIA) 10 MG tablet TAKE 1 TABLET(10 MG) BY MOUTH DAILY  ? [START ON 03/13/2022] filgrastim-sndz (ZARXIO) 300 MCG/0.5ML SOSY injection Inject into the skin.  ? pregabalin (LYRICA) 50 MG capsule Take 50 mg by mouth 2 (two)  times daily.  ? [DISCONTINUED] fluticasone (FLONASE) 50 MCG/ACT nasal spray Place 2 sprays into both nostrils 2 (two) times daily. (Patient not taking: Reported on 08/15/2019)  ? ?No current facility-administered medications on file prior to visit.  ? ? ?Allergies:  ?No Known Allergies ? ?Social History:  ?Social History  ? ?Socioeconomic History  ? Marital status: Single  ?  Spouse name: Not on file  ? Number of children: Not on file  ? Years of education: Not on file  ? Highest education level: Not on file  ?Occupational History  ? Not on file  ?Tobacco Use  ? Smoking status: Never  ? Smokeless tobacco: Never  ?Vaping Use  ? Vaping Use: Never used  ?Substance and Sexual Activity  ? Alcohol use: Yes  ?  Comment: rarely  ? Drug use: No  ? Sexual activity: Never  ?Other Topics Concern  ? Not on file  ?Social History Narrative  ? Not on file  ? ?Social Determinants of Health  ? ?Financial Resource Strain: Not on file  ?Food Insecurity: Not on file  ?Transportation Needs: Not on file   ?Physical Activity: Not on file  ?Stress: Not on file  ?Social Connections: Not on file  ?Intimate Partner Violence: Not on file  ? ?Social History  ? ?Tobacco Use  ?Smoking Status Never  ?Smokeless Tobacco Never  ? ?Social History  ? ?Substance and Sexual Activity  ?Alcohol Use Yes  ? Comment: rarely  ? ? ?Family History:  ?Family History  ?Problem Relation Age of Onset  ? Hypertension Mother   ? Gout Mother   ? Heart disease Mother   ? Breast cancer Maternal Aunt 60  ? Stroke Maternal Aunt   ? Sickle cell trait Other   ? Diabetes Other   ? Alcohol abuse Father   ? Kidney disease Brother   ? Heart attack Paternal Aunt   ? Colon cancer Paternal Uncle   ? Alcohol abuse Brother   ? Alcohol abuse Brother   ? Breast cancer Cousin 71  ?     mat cousin  ? ? ?Past medical history, surgical history, medications, allergies, family history and social history reviewed with patient today and changes made to appropriate areas of the chart.  ? ?Review of Systems  ?Eyes:  Negative for blurred vision and double vision.  ?Respiratory:  Negative for shortness of breath.   ?Cardiovascular:  Negative for chest pain, palpitations and leg swelling.  ?Neurological:  Positive for tingling. Negative for dizziness and headaches.  ?All other ROS negative except what is listed above and in the HPI.  ? ?   ?Objective:  ?  ?BP 127/78   Pulse 80   Temp 99.2 ?F (37.3 ?C) (Oral)   Ht 5' 0.9" (1.547 m)   Wt 145 lb 3.2 oz (65.9 kg)   SpO2 98%   BMI 27.53 kg/m?   ?Wt Readings from Last 3 Encounters:  ?03/04/22 145 lb 3.2 oz (65.9 kg)  ?09/17/21 150 lb (68 kg)  ?06/16/21 150 lb 2 oz (68.1 kg)  ?  ?Physical Exam ?Vitals and nursing note reviewed. Exam conducted with a chaperone present Yvonna Alanis, [place]).  ?Constitutional:   ?   General: She is awake. She is not in acute distress. ?   Appearance: She is well-developed. She is not ill-appearing.  ?HENT:  ?   Head: Normocephalic and atraumatic.  ?   Right Ear: Hearing, tympanic membrane, ear  canal and external ear normal. No drainage.  ?  Left Ear: Hearing, tympanic membrane, ear canal and external ear normal. No drainage.  ?   Nose: Nose normal.  ?   Right Sinus: No maxillary sinus tenderness or frontal sinus tenderness.  ?   Left Sinus: No maxillary sinus tenderness or frontal sinus tenderness.  ?   Mouth/Throat:  ?   Mouth: Mucous membranes are moist.  ?   Pharynx: Oropharynx is clear. Uvula midline. No pharyngeal swelling, oropharyngeal exudate or posterior oropharyngeal erythema.  ?Eyes:  ?   General: Lids are normal.     ?   Right eye: No discharge.     ?   Left eye: No discharge.  ?   Extraocular Movements: Extraocular movements intact.  ?   Conjunctiva/sclera: Conjunctivae normal.  ?   Pupils: Pupils are equal, round, and reactive to light.  ?   Visual Fields: Right eye visual fields normal and left eye visual fields normal.  ?Neck:  ?   Thyroid: No thyromegaly.  ?   Vascular: No carotid bruit.  ?   Trachea: Trachea normal.  ?Cardiovascular:  ?   Rate and Rhythm: Normal rate and regular rhythm.  ?   Heart sounds: Normal heart sounds. No murmur heard. ?  No gallop.  ?Pulmonary:  ?   Effort: Pulmonary effort is normal. No accessory muscle usage or respiratory distress.  ?   Breath sounds: Normal breath sounds.  ?Chest:  ?Breasts: ?   Breasts are symmetrical.  ?   Right: Normal.  ?   Left: Normal.  ?Abdominal:  ?   General: Bowel sounds are normal.  ?   Palpations: Abdomen is soft. There is no hepatomegaly or splenomegaly.  ?   Tenderness: There is no abdominal tenderness.  ?Musculoskeletal:     ?   General: Normal range of motion.  ?   Cervical back: Normal range of motion and neck supple.  ?   Right lower leg: No edema.  ?   Left lower leg: No edema.  ?Lymphadenopathy:  ?   Head:  ?   Right side of head: No submental, submandibular, tonsillar, preauricular or posterior auricular adenopathy.  ?   Left side of head: No submental, submandibular, tonsillar, preauricular or posterior auricular  adenopathy.  ?   Cervical: No cervical adenopathy.  ?   Upper Body:  ?   Right upper body: No supraclavicular, axillary or pectoral adenopathy.  ?   Left upper body: No supraclavicular, axillary or pectoral adenopathy.  ?S

## 2022-03-04 NOTE — Assessment & Plan Note (Signed)
Continue to follow up with Oncologist.   ?

## 2022-03-05 LAB — LIPID PANEL
Chol/HDL Ratio: 2.2 ratio (ref 0.0–4.4)
Cholesterol, Total: 185 mg/dL (ref 100–199)
HDL: 85 mg/dL (ref >39)
LDL Chol Calc (NIH): 90 mg/dL (ref 0–99)
Triglycerides: 49 mg/dL (ref 0–149)
VLDL Cholesterol Cal: 10 mg/dL (ref 5–40)

## 2022-03-05 LAB — CBC WITH DIFFERENTIAL/PLATELET
Basophils Absolute: 0 10*3/uL (ref 0.0–0.2)
Basos: 1 %
EOS (ABSOLUTE): 0.1 10*3/uL (ref 0.0–0.4)
Eos: 2 %
Hematocrit: 37.1 % (ref 34.0–46.6)
Hemoglobin: 12 g/dL (ref 11.1–15.9)
Immature Grans (Abs): 0 10*3/uL (ref 0.0–0.1)
Immature Granulocytes: 0 %
Lymphocytes Absolute: 1.3 10*3/uL (ref 0.7–3.1)
Lymphs: 31 %
MCH: 27 pg (ref 26.6–33.0)
MCHC: 32.3 g/dL (ref 31.5–35.7)
MCV: 84 fL (ref 79–97)
Monocytes Absolute: 0.5 10*3/uL (ref 0.1–0.9)
Monocytes: 11 %
Neutrophils Absolute: 2.4 10*3/uL (ref 1.4–7.0)
Neutrophils: 55 %
Platelets: 181 10*3/uL (ref 150–450)
RBC: 4.44 x10E6/uL (ref 3.77–5.28)
RDW: 17 % — ABNORMAL HIGH (ref 11.7–15.4)
WBC: 4.3 10*3/uL (ref 3.4–10.8)

## 2022-03-05 LAB — URINALYSIS, ROUTINE W REFLEX MICROSCOPIC
Bilirubin, UA: NEGATIVE
Glucose, UA: NEGATIVE
Ketones, UA: NEGATIVE
Nitrite, UA: NEGATIVE
RBC, UA: NEGATIVE
Specific Gravity, UA: 1.025 (ref 1.005–1.030)
Urobilinogen, Ur: 0.2 mg/dL (ref 0.2–1.0)
pH, UA: 6 (ref 5.0–7.5)

## 2022-03-05 LAB — COMPREHENSIVE METABOLIC PANEL
ALT: 23 IU/L (ref 0–32)
AST: 28 IU/L (ref 0–40)
Albumin/Globulin Ratio: 2.2 (ref 1.2–2.2)
Albumin: 4.4 g/dL (ref 3.8–4.9)
Alkaline Phosphatase: 50 IU/L (ref 44–121)
BUN/Creatinine Ratio: 19 (ref 9–23)
BUN: 13 mg/dL (ref 6–24)
Bilirubin Total: 0.3 mg/dL (ref 0.0–1.2)
CO2: 22 mmol/L (ref 20–29)
Calcium: 9.3 mg/dL (ref 8.7–10.2)
Chloride: 103 mmol/L (ref 96–106)
Creatinine, Ser: 0.68 mg/dL (ref 0.57–1.00)
Globulin, Total: 2 g/dL (ref 1.5–4.5)
Glucose: 100 mg/dL — ABNORMAL HIGH (ref 70–99)
Potassium: 4.2 mmol/L (ref 3.5–5.2)
Sodium: 141 mmol/L (ref 134–144)
Total Protein: 6.4 g/dL (ref 6.0–8.5)
eGFR: 101 mL/min/{1.73_m2} (ref >59)

## 2022-03-05 LAB — MICROSCOPIC EXAMINATION: RBC, Urine: NONE SEEN /hpf (ref 0–2)

## 2022-03-05 LAB — TSH: TSH: 0.969 u[IU]/mL (ref 0.450–4.500)

## 2022-03-07 NOTE — Progress Notes (Signed)
Please let patient know that her lab work looked good.  Her complete blood count, liver, kidneys, electrolytes, cholesterol and thyroid look good.  Her urine did show a good amount of bacteria in it so I have sent it for culture.  Once I receive the results I will let her know whether she needs further treatment.  Please let me know if she has any questions.

## 2022-03-07 NOTE — Addendum Note (Signed)
Addended by: Jon Billings on: 03/07/2022 08:27 AM ? ? Modules accepted: Orders ? ?

## 2022-03-09 LAB — URINE CULTURE

## 2022-03-09 NOTE — Progress Notes (Signed)
Hi Ms. Sara Greer.  Your urine culture did not grow any bacteria.  No need for further treatment at this time.

## 2022-03-25 ENCOUNTER — Encounter: Payer: Managed Care, Other (non HMO) | Admitting: Nurse Practitioner

## 2022-03-30 ENCOUNTER — Encounter: Payer: Managed Care, Other (non HMO) | Admitting: Nurse Practitioner

## 2022-05-22 ENCOUNTER — Other Ambulatory Visit: Payer: Self-pay | Admitting: Nurse Practitioner

## 2022-05-23 NOTE — Telephone Encounter (Signed)
Requested Prescriptions  Pending Prescriptions Disp Refills  . atorvastatin (LIPITOR) 40 MG tablet [Pharmacy Med Name: ATORVASTATIN '40MG'$  TABLETS] 90 tablet 0    Sig: TAKE 1 TABLET(40 MG) BY MOUTH DAILY     Cardiovascular:  Antilipid - Statins Failed - 05/22/2022  7:58 AM      Failed - Lipid Panel in normal range within the last 12 months    Cholesterol, Total  Date Value Ref Range Status  03/04/2022 185 100 - 199 mg/dL Final   LDL Chol Calc (NIH)  Date Value Ref Range Status  03/04/2022 90 0 - 99 mg/dL Final   HDL  Date Value Ref Range Status  03/04/2022 85 >39 mg/dL Final   Triglycerides  Date Value Ref Range Status  03/04/2022 49 0 - 149 mg/dL Final         Passed - Patient is not pregnant      Passed - Valid encounter within last 12 months    Recent Outpatient Visits          2 months ago Annual physical exam   [facility] Jon Billings, NP   8 months ago Aortic atherosclerosis ([place])   [facility] Jon Billings, NP   11 months ago Aortic atherosclerosis ([place])   [facility] Jon Billings, NP   1 year ago Hyperlipidemia, unspecified hyperlipidemia type   [facility], [place], [place]   2 years ago Hyperlipidemia, unspecified hyperlipidemia type   [facility], [place], [place]      Future Appointments            In 3 months Jon Billings, NP [facility], [facility]           . ezetimibe (ZETIA) 10 MG tablet [Pharmacy Med Name: EZETIMIBE '10MG'$  TABLETS] 90 tablet 0    Sig: TAKE 1 TABLET(10 MG) BY MOUTH DAILY     Cardiovascular:  Antilipid - Sterol Transport Inhibitors Failed - 05/22/2022  7:58 AM      Failed - Lipid Panel in normal range within the last 12 months    Cholesterol, Total  Date Value Ref Range Status  03/04/2022 185 100 - 199 mg/dL Final   LDL Chol Calc (NIH)  Date Value Ref Range Status  03/04/2022 90 0 - 99 mg/dL Final   HDL  Date  Value Ref Range Status  03/04/2022 85 >39 mg/dL Final   Triglycerides  Date Value Ref Range Status  03/04/2022 49 0 - 149 mg/dL Final         Passed - AST in normal range and within 360 days    AST  Date Value Ref Range Status  03/04/2022 28 0 - 40 IU/L Final         Passed - ALT in normal range and within 360 days    ALT  Date Value Ref Range Status  03/04/2022 23 0 - 32 IU/L Final         Passed - Patient is not pregnant      Passed - Valid encounter within last 12 months    Recent Outpatient Visits          2 months ago Annual physical exam   [facility] Jon Billings, NP   8 months ago Aortic atherosclerosis [facility])   [facility] Jon Billings, NP   11 months ago Aortic atherosclerosis [facility])   [facility] Jon Billings, NP   1 year ago Hyperlipidemia, unspecified hyperlipidemia type  [place], [place], [place]   2 years ago Hyperlipidemia, unspecified hyperlipidemia type   [facility], [place], [place]      Future Appointments            In 3 months Jon Billings, NP [facility], [place]

## 2022-06-23 DIAGNOSIS — Z9481 Bone marrow transplant status: Secondary | ICD-10-CM | POA: Insufficient documentation

## 2022-08-03 ENCOUNTER — Telehealth: Payer: Self-pay

## 2022-08-03 NOTE — Telephone Encounter (Signed)
Patient is overdue for mammogram. Called and asked patient if she would like to have this ordered and scheduled at [facility] for her. Verbal from patient agreeing to this.    Called [place] and scheduled the patient's mammogram for 08/24/2022 at 8:00 AM.    Called and notified patient of appointment date and time.

## 2022-08-24 ENCOUNTER — Ambulatory Visit
Admission: RE | Admit: 2022-08-24 | Discharge: 2022-08-24 | Disposition: A | Discharge status: Home / Self Care | Payer: Managed Care, Other (non HMO) | Source: Ambulatory Visit | Attending: Nurse Practitioner | Admitting: Nurse Practitioner

## 2022-08-24 DIAGNOSIS — Z1231 Encounter for screening mammogram for malignant neoplasm of breast: Secondary | ICD-10-CM | POA: Diagnosis present

## 2022-08-25 NOTE — Progress Notes (Signed)
Please let patient know her Mammogram did not show any evidence of a malignancy.  The recommendation is to repeat the Mammogram in 1 year.  

## 2022-08-31 ENCOUNTER — Other Ambulatory Visit: Payer: Self-pay | Admitting: Nurse Practitioner

## 2022-09-01 NOTE — Telephone Encounter (Signed)
Requested Prescriptions  Pending Prescriptions Disp Refills  . ezetimibe (ZETIA) 10 MG tablet [Pharmacy Med Name: EZETIMIBE '10MG'$  TABLETS] 90 tablet 0    Sig: TAKE 1 TABLET(10 MG) BY MOUTH DAILY     Cardiovascular:  Antilipid - Sterol Transport Inhibitors Failed - 08/31/2022  3:17 PM      Failed - Lipid Panel in normal range within the last 12 months    Cholesterol, Total  Date Value Ref Range Status  03/04/2022 185 100 - 199 mg/dL Final   LDL Chol Calc (NIH)  Date Value Ref Range Status  03/04/2022 90 0 - 99 mg/dL Final   HDL  Date Value Ref Range Status  03/04/2022 85 >39 mg/dL Final   Triglycerides  Date Value Ref Range Status  03/04/2022 49 0 - 149 mg/dL Final         Passed - AST in normal range and within 360 days    AST  Date Value Ref Range Status  03/04/2022 28 0 - 40 IU/L Final         Passed - ALT in normal range and within 360 days    ALT  Date Value Ref Range Status  03/04/2022 23 0 - 32 IU/L Final         Passed - Patient is not pregnant      Passed - Valid encounter within last 12 months    Recent Outpatient Visits          6 months ago Annual physical exam   [facility] Jon Billings, NP   11 months ago Aortic atherosclerosis ([place])   [facility] Jon Billings, NP   1 year ago Aortic atherosclerosis ([place])   [facility] Jon Billings, NP   2 years ago Hyperlipidemia, unspecified hyperlipidemia type   [facility], [place], [place]   2 years ago Hyperlipidemia, unspecified hyperlipidemia type   [facility], [place], [place]      Future Appointments            In 5 days Jon Billings, NP [facility], [place]

## 2022-09-05 NOTE — Progress Notes (Unsigned)
There were no vitals taken for this visit.   Subjective:    Patient ID: Sara Greer, female    DOB: 1961/07/21, 61 y.o.   MRN: [id number]  HPI: Sara Greer is a 61 y.o. female  No chief complaint on file.  HYPERLIPIDEMIA Hyperlipidemia status: excellent compliance Satisfied with current treatment?  yes Side effects:  no Medication compliance: excellent compliance Past cholesterol meds: atorvastain (lipitor) and ezetimide (zetia) Supplements: none Aspirin:  no The 10-year ASCVD risk score (Arnett DK, et al., 2019) is: 2.6%   Values used to calculate the score:     Age: 53 years     Sex: Female     Is Non-Hispanic African American: Yes     Diabetic: No     Tobacco smoker: No     Systolic Blood Pressure: 031 mmHg     Is BP treated: No     HDL Cholesterol: 85 mg/dL     Total Cholesterol: 185 mg/dL Chest pain:  no Coronary artery disease:  no Family history CAD:  no Family history early CAD:  no  Patient states she was at [facility] this morning following up after her back surgery and they told her she needs to follow up and get her medications from her pharmacy. She will need Gabapentin.  Relevant past medical, surgical, family and social history reviewed and updated as indicated. Interim medical history since our last visit reviewed. Allergies and medications reviewed and updated.  Review of Systems  Eyes:  Negative for visual disturbance.  Respiratory:  Negative for cough, chest tightness and shortness of breath.   Cardiovascular:  Negative for chest pain, palpitations and leg swelling.  Musculoskeletal:  Positive for back pain.  Neurological:  Negative for dizziness and headaches.   Per HPI unless specifically indicated above     Objective:    There were no vitals taken for this visit.  Wt Readings from Last 3 Encounters:  03/04/22 145 lb 3.2 oz (65.9 kg)  09/17/21 150 lb (68 kg)  06/16/21 150 lb 2 oz (68.1 kg)    Physical Exam Vitals  and nursing note reviewed.  Constitutional:      General: She is not in acute distress.    Appearance: Normal appearance. She is normal weight. She is not ill-appearing, toxic-appearing or diaphoretic.  HENT:     Head: Normocephalic.     Right Ear: External ear normal.     Left Ear: External ear normal.     Nose: Nose normal.     Mouth/Throat:     Mouth: Mucous membranes are moist.     Pharynx: Oropharynx is clear.  Eyes:     General:        Right eye: No discharge.        Left eye: No discharge.     Extraocular Movements: Extraocular movements intact.     Conjunctiva/sclera: Conjunctivae normal.     Pupils: Pupils are equal, round, and reactive to light.  Cardiovascular:     Rate and Rhythm: Normal rate and regular rhythm.     Heart sounds: No murmur heard. Pulmonary:     Effort: Pulmonary effort is normal. No respiratory distress.     Breath sounds: Normal breath sounds. No wheezing or rales.  Musculoskeletal:     Cervical back: Normal range of motion and neck supple.     Comments: Back brace  Skin:    General: Skin is warm and dry.     Capillary Refill: Capillary refill  takes less than 2 seconds.  Neurological:     General: No focal deficit present.     Mental Status: She is alert and oriented to person, place, and time. Mental status is at baseline.  Psychiatric:        Mood and Affect: Mood normal.        Behavior: Behavior normal.        Thought Content: Thought content normal.        Judgment: Judgment normal.    Results for orders placed or performed in visit on 03/04/22  Microscopic Examination   Urine  Result Value Ref Range   WBC, UA 6-10 (A) 0 - 5 /hpf   RBC, Urine None seen 0 - 2 /hpf   Epithelial Cells (non renal) 0-10 0 - 10 /hpf   Mucus, UA Present (A) Not Estab.   Bacteria, UA Moderate (A) None seen/Few  Urine Culture   Specimen: Urine   UR  Result Value Ref Range   Urine Culture, Routine Final report    Organism ID, Bacteria Comment   CBC with  Differential/Platelet  Result Value Ref Range   WBC 4.3 3.4 - 10.8 x10E3/uL   RBC 4.44 3.77 - 5.28 x10E6/uL   Hemoglobin 12.0 11.1 - 15.9 g/dL   Hematocrit 37.1 34.0 - 46.6 %   MCV 84 79 - 97 fL   MCH 27.0 26.6 - 33.0 pg   MCHC 32.3 31.5 - 35.7 g/dL   RDW 17.0 (H) 11.7 - 15.4 %   Platelets 181 150 - 450 x10E3/uL   Neutrophils 55 Not Estab. %   Lymphs 31 Not Estab. %   Monocytes 11 Not Estab. %   Eos 2 Not Estab. %   Basos 1 Not Estab. %   Neutrophils Absolute 2.4 1.4 - 7.0 x10E3/uL   Lymphocytes Absolute 1.3 0.7 - 3.1 x10E3/uL   Monocytes Absolute 0.5 0.1 - 0.9 x10E3/uL   EOS (ABSOLUTE) 0.1 0.0 - 0.4 x10E3/uL   Basophils Absolute 0.0 0.0 - 0.2 x10E3/uL   Immature Granulocytes 0 Not Estab. %   Immature Grans (Abs) 0.0 0.0 - 0.1 x10E3/uL  Comprehensive metabolic panel  Result Value Ref Range   Glucose 100 (H) 70 - 99 mg/dL   BUN 13 6 - 24 mg/dL   Creatinine, Ser 0.68 0.57 - 1.00 mg/dL   eGFR 101 >59 mL/min/1.73   BUN/Creatinine Ratio 19 9 - 23   Sodium 141 134 - 144 mmol/L   Potassium 4.2 3.5 - 5.2 mmol/L   Chloride 103 96 - 106 mmol/L   CO2 22 20 - 29 mmol/L   Calcium 9.3 8.7 - 10.2 mg/dL   Total Protein 6.4 6.0 - 8.5 g/dL   Albumin 4.4 3.8 - 4.9 g/dL   Globulin, Total 2.0 1.5 - 4.5 g/dL   Albumin/Globulin Ratio 2.2 1.2 - 2.2   Bilirubin Total 0.3 0.0 - 1.2 mg/dL   Alkaline Phosphatase 50 44 - 121 IU/L   AST 28 0 - 40 IU/L   ALT 23 0 - 32 IU/L  Lipid panel  Result Value Ref Range   Cholesterol, Total 185 100 - 199 mg/dL   Triglycerides 49 0 - 149 mg/dL   HDL 85 >39 mg/dL   VLDL Cholesterol Cal 10 5 - 40 mg/dL   LDL Chol Calc (NIH) 90 0 - 99 mg/dL   Chol/HDL Ratio 2.2 0.0 - 4.4 ratio  TSH  Result Value Ref Range   TSH 0.969 0.450 - 4.500 uIU/mL  Urinalysis,  Routine w reflex microscopic  Result Value Ref Range   Specific Gravity, UA 1.025 1.005 - 1.030   pH, UA 6.0 5.0 - 7.5   Color, UA Yellow Yellow   Appearance Ur Cloudy (A) Clear   Leukocytes,UA 2+ (A)  Negative   Protein,UA Trace (A) Negative/Trace   Glucose, UA Negative Negative   Ketones, UA Negative Negative   RBC, UA Negative Negative   Bilirubin, UA Negative Negative   Urobilinogen, Ur 0.2 0.2 - 1.0 mg/dL   Nitrite, UA Negative Negative   Microscopic Examination See below:       Assessment & Plan:   Problem List Items Addressed This Visit      Cardiovascular and Mediastinum   Aortic atherosclerosis ([place])     Other   Hyperlipemia - Primary     Follow up plan: No follow-ups on file.

## 2022-09-06 ENCOUNTER — Ambulatory Visit (INDEPENDENT_AMBULATORY_CARE_PROVIDER_SITE_OTHER): Payer: Managed Care, Other (non HMO) | Admitting: Nurse Practitioner

## 2022-09-06 ENCOUNTER — Encounter: Payer: Self-pay | Admitting: Nurse Practitioner

## 2022-09-06 VITALS — BP 122/68 | HR 60 | Temp 98.5°F | Wt 148.4 lb

## 2022-09-06 DIAGNOSIS — E785 Hyperlipidemia, unspecified: Secondary | ICD-10-CM

## 2022-09-06 DIAGNOSIS — G629 Polyneuropathy, unspecified: Secondary | ICD-10-CM | POA: Diagnosis not present

## 2022-09-06 DIAGNOSIS — I700 Atherosclerosis of aorta: Secondary | ICD-10-CM

## 2022-09-06 MED ORDER — EZETIMIBE 10 MG PO TABS: ORAL_TABLET | ORAL | 1 refills | Status: DC

## 2022-09-06 NOTE — Assessment & Plan Note (Signed)
Taking Gabapentin and Lyrica which help control her symptoms.  See's Dr. Nadara Mustard at [facility] who manages the medications.

## 2022-09-06 NOTE — Assessment & Plan Note (Signed)
Chronic.  Controlled.  Continue with current medication regimen of Zetia '10mg'$ . Refills sent today.  Labs ordered today.  Return to clinic in 6 months for reevaluation.  Call sooner if concerns arise.

## 2022-09-06 NOTE — Assessment & Plan Note (Signed)
Chronic.  Controlled.  Continue with current medication regimen of Zetia '10mg'$ .  States she is not taking Atorvastatin. Refills sent today.  Labs ordered today.  Return to clinic in 6 months for reevaluation.  Call sooner if concerns arise.

## 2022-09-07 LAB — COMPREHENSIVE METABOLIC PANEL
ALT: 26 IU/L (ref 0–32)
AST: 35 IU/L (ref 0–40)
Albumin/Globulin Ratio: 1.4 (ref 1.2–2.2)
Albumin: 3.9 g/dL (ref 3.8–4.9)
Alkaline Phosphatase: 66 IU/L (ref 44–121)
BUN/Creatinine Ratio: 14 (ref 9–23)
BUN: 11 mg/dL (ref 6–24)
Bilirubin Total: 0.2 mg/dL (ref 0.0–1.2)
CO2: 20 mmol/L (ref 20–29)
Calcium: 8.5 mg/dL — ABNORMAL LOW (ref 8.7–10.2)
Chloride: 106 mmol/L (ref 96–106)
Creatinine, Ser: 0.79 mg/dL (ref 0.57–1.00)
Globulin, Total: 2.8 g/dL (ref 1.5–4.5)
Glucose: 86 mg/dL (ref 70–99)
Potassium: 4.5 mmol/L (ref 3.5–5.2)
Sodium: 141 mmol/L (ref 134–144)
Total Protein: 6.7 g/dL (ref 6.0–8.5)
eGFR: 87 mL/min/{1.73_m2} (ref >59)

## 2022-09-07 LAB — LIPID PANEL
Chol/HDL Ratio: 3 ratio (ref 0.0–4.4)
Cholesterol, Total: 231 mg/dL — ABNORMAL HIGH (ref 100–199)
HDL: 78 mg/dL (ref >39)
LDL Chol Calc (NIH): 142 mg/dL — ABNORMAL HIGH (ref 0–99)
Triglycerides: 66 mg/dL (ref 0–149)
VLDL Cholesterol Cal: 11 mg/dL (ref 5–40)

## 2022-09-07 NOTE — Progress Notes (Signed)
Please let patient know that her lab work looks good.  Cholesterol is elevated from prior.  I recommend a low fat diet and exercise.  No other concerns at this time.  Follow up as discussed.

## 2023-02-15 NOTE — Progress Notes (Unsigned)
          Acute Office Visit   Patient: Sara Greer   DOB: 05/25/1961   62 y.o. Female  MRN: [id number] Visit Date: 02/16/2023  Today's healthcare provider: Dani Gobble Marquice Uddin, PA-C  Introduced myself to the patient as a Journalist, newspaper and provided education on APPs in clinical practice.    No chief complaint on file.  Subjective    HPI    Medications: Outpatient Medications Prior to Visit  Medication Sig   acetaminophen (TYLENOL) 500 MG tablet Take 500 mg by mouth every 6 (six) hours as needed.   acyclovir (ZOVIRAX) 400 MG tablet Take 400 mg by mouth 2 (two) times daily.   aspirin 81 MG chewable tablet Chew by mouth daily.   ezetimibe (ZETIA) 10 MG tablet TAKE 1 TABLET(10 MG) BY MOUTH DAILY   pregabalin (LYRICA) 50 MG capsule Take 50 mg by mouth 2 (two) times daily.   No facility-administered medications prior to visit.    Review of Systems  {Labs  Heme  Chem  Endocrine  Serology  Results Review (optional):23779}   Objective    There were no vitals taken for this visit. {Show previous vital signs (optional):23777}  Physical Exam    No results found for any visits on 02/16/23.  Assessment & Plan      No follow-ups on file.

## 2023-02-16 ENCOUNTER — Ambulatory Visit (INDEPENDENT_AMBULATORY_CARE_PROVIDER_SITE_OTHER): Payer: Managed Care, Other (non HMO) | Admitting: Physician Assistant

## 2023-02-16 ENCOUNTER — Encounter: Payer: Self-pay | Admitting: Physician Assistant

## 2023-02-16 VITALS — BP 93/60 | HR 54 | Temp 98.4°F | Ht 60.9 in | Wt 158.7 lb

## 2023-02-16 DIAGNOSIS — R202 Paresthesia of skin: Secondary | ICD-10-CM

## 2023-02-16 DIAGNOSIS — R200 Anesthesia of skin: Secondary | ICD-10-CM | POA: Diagnosis not present

## 2023-02-16 NOTE — Progress Notes (Signed)
Established Patient Office Visit  Name: Sara Greer   MRN: [id number]    DOB: 1961/02/11   Date:02/16/2023  Today's Provider: Talitha Givens, MHS, PA-C Introduced myself to the patient as a PA-C and provided education on APPs in clinical practice.         Subjective  Chief Complaint  Chief Complaint  Patient presents with   Referral    Patient wants to discuss referral to Neurology.    Neurologic Problem    Patient says she told that she may have Neuropathy as she is having issues with her hands such numbness and pain in her hands and says at night it is the worse. Patient says was prescribed Lyrica.     Neurologic Problem The patient's primary symptoms include weakness.    Neuropathy  Patient is right hand dominant and symptoms are predominantly in right hand and lower right arm (from elbow down)  This has been ongoing for sometime She reports she has been given Lyrica and Gabapentin by Dr. Nadara Mustard  She states she would like a referral to Neurology for further evaluation  She reports she is having right hand and arm pain, numbness and tingling  She states she wakes up with it hurting at night  She reports improvement with making a tight fist and using a rigid wrist brace  She reports the tingling comes and goes   She states it is usually the whole hand and palm but sometimes just her fingers are affected- she is not able to pinpoint particular fingers   Arm pain can sometimes radiate up to the elbow - this is usually more prevalent in the evening- swinging and bending her arm usually helps with this       Patient Active Problem List   Diagnosis Date Noted   Numbness and tingling of right upper extremity 02/16/2023   Neuropathy 09/06/2022   IgG multiple myeloma ([place]) 02/16/2022   Pre-transplant evaluation for stem cell transplant 02/16/2022   Osteoarthritis 02/12/2020   Aortic atherosclerosis ([place]) 02/12/2020   Allergic rhinitis 03/19/2018    Hyperlipemia 12/30/2017   Chronic left-sided low back pain without sciatica 07/12/2017    Past Surgical History:  Procedure Laterality Date   BREAST BIOPSY Left 05/24/2018   neg/ bx/clip/u/s   CESAREAN SECTION     MYOMECTOMY     OOPHORECTOMY     SPINE SURGERY  08/05/2021   TOTAL VAGINAL HYSTERECTOMY     TUBAL LIGATION      Family History  Problem Relation Age of Onset   Hypertension Mother    Gout Mother    Heart disease Mother    Breast cancer Maternal Aunt 36   Stroke Maternal Aunt    Sickle cell trait Other    Diabetes Other    Alcohol abuse Father    Kidney disease Brother    Heart attack Paternal Aunt    Colon cancer Paternal Uncle    Alcohol abuse Brother    Alcohol abuse Brother    Breast cancer Cousin 53       mat cousin    Social History   Tobacco Use   Smoking status: Never   Smokeless tobacco: Never  Substance Use Topics   Alcohol use: Yes    Comment: rarely     Current Outpatient Medications:    acetaminophen (TYLENOL) 500 MG tablet, Take 500 mg by mouth every 6 (six) hours as needed., Disp: , Rfl:  acyclovir (ZOVIRAX) 400 MG tablet, Take 400 mg by mouth 2 (two) times daily., Disp: , Rfl:    aspirin 81 MG chewable tablet, Chew by mouth daily., Disp: , Rfl:    ezetimibe (ZETIA) 10 MG tablet, TAKE 1 TABLET(10 MG) BY MOUTH DAILY, Disp: 90 tablet, Rfl: 1   pregabalin (LYRICA) 50 MG capsule, Take 50 mg by mouth 2 (two) times daily., Disp: , Rfl:   No Known Allergies  I personally reviewed active problem list, medication list, allergies, notes from last encounter, lab results with the patient/caregiver today.   Review of Systems  Neurological:  Positive for tingling, tremors and weakness.      Objective  Vitals:   02/16/23 0807  BP: 93/60  Pulse: (!) 54  Temp: 98.4 F (36.9 C)  TempSrc: Oral  SpO2: 96%  Weight: 158 lb 11.2 oz (72 kg)  Height: 5' 0.9" (1.547 m)    Body mass index is 30.08 kg/m.  Physical Exam Vitals reviewed.   Constitutional:      General: She is awake.     Appearance: Normal appearance. She is well-developed and well-groomed.  HENT:     Head: Normocephalic and atraumatic.  Musculoskeletal:     Right elbow: Normal. No swelling or deformity. Normal range of motion.     Left elbow: Normal. No swelling or deformity. Normal range of motion.     Right forearm: Normal.     Left forearm: Normal.     Right wrist: Normal. No swelling, effusion, snuff box tenderness or crepitus. Normal range of motion. Normal pulse.     Left wrist: Normal. Normal pulse.     Right hand: No swelling, deformity or tenderness. Normal range of motion. Normal strength. Normal pulse.     Left hand: No swelling, deformity or tenderness. Normal range of motion. Normal strength. Normal pulse.     Cervical back: Normal range of motion.  Neurological:     Mental Status: She is alert.  Psychiatric:        Behavior: Behavior is cooperative.      No results found for this or any previous visit (from the past 2160 hour(s)).   PHQ2/9:    02/16/2023    8:12 AM 09/06/2022    8:08 AM 03/04/2022    4:02 PM 06/16/2021    8:52 AM 02/12/2020    9:03 AM  Depression screen PHQ 2/9  Decreased Interest 0 0 0 0 0  Down, Depressed, Hopeless 1 1 0 1 0  PHQ - 2 Score 1 1 0 1 0  Altered sleeping 3 0 3  0  Tired, decreased energy 0 0 1  0  Change in appetite 2 0 0  0  Feeling bad or failure about yourself  0 0 0  0  Trouble concentrating 0 0 0  0  Moving slowly or fidgety/restless 0 0 0  0  Suicidal thoughts 0 0 0  0  PHQ-9 Score '6 1 4  '$ 0  Difficult doing work/chores Not difficult at all Not difficult at all Not difficult at all        Fall Risk:    02/16/2023    8:12 AM 09/06/2022    8:08 AM 03/04/2022    4:01 PM 06/16/2021    8:52 AM 02/12/2020    9:02 AM  Fall Risk   Falls in the past year? 0 0 0 0 1  Number falls in past yr: 0 0 0 0 0  Injury with Fall? 0  0 0 0 0  Risk for fall due to : No Fall Risks No Fall Risks No Fall Risks No  Fall Risks   Follow up Falls evaluation completed Falls evaluation completed Falls evaluation completed Falls evaluation completed       Functional Status Survey:      Assessment & Plan  Problem List Items Addressed This Visit       Other   Numbness and tingling of right upper extremity - Primary    Chronic, ongoing Patient reports numbness, tingling and pain in right upper extremity from the elbow to fingers that ebbs and flows in intensity  PE was overall normal and strength is 5/5 at fingers, grip strength, wrists, and elbows along with normal ROM. No tremor observed today  Cannot rule out carpal tunnel or cubital tunnel issues which we discussed during apt.  She would like to see Neurology to discuss her symptoms - referral placed today to Dr. Trena Platt office per request If this is carpal or cubital tunnel etiology, will place referral to Ortho for evaluation  Follow up after Neurology apt to discuss further care options and care coordination      Relevant Orders   Ambulatory referral to Neurology     No follow-ups on file.   I, Mera Gunkel E Vasti Yagi, PA-C, have reviewed all documentation for this visit. The documentation on 02/16/23 for the exam, diagnosis, procedures, and orders are all accurate and complete.   Talitha Givens, MHS, PA-C [facility]

## 2023-02-16 NOTE — Assessment & Plan Note (Signed)
Chronic, ongoing Patient reports numbness, tingling and pain in right upper extremity from the elbow to fingers that ebbs and flows in intensity  PE was overall normal and strength is 5/5 at fingers, grip strength, wrists, and elbows along with normal ROM. No tremor observed today  Cannot rule out carpal tunnel or cubital tunnel issues which we discussed during apt.  She would like to see Neurology to discuss her symptoms - referral placed today to Dr. Trena Platt office per request If this is carpal or cubital tunnel etiology, will place referral to Ortho for evaluation  Follow up after Neurology apt to discuss further care options and care coordination

## 2023-03-06 NOTE — Progress Notes (Deleted)
There were no vitals taken for this visit.   Subjective:    Patient ID: Sara Greer, female    DOB: [birth year], 62 y.o.   MRN: [id number]  HPI: Sara Greer is a 62 y.o. female presenting on 03/07/2023 for comprehensive medical examination. Current medical complaints include:none  She currently lives with: Menopausal Symptoms: no  HYPERLIPIDEMIA Hyperlipidemia status: excellent compliance Satisfied with current treatment?  no Side effects:  no Medication compliance: excellent compliance Past cholesterol meds: atorvastain (lipitor) Supplements: none Aspirin:  no The 10-year ASCVD risk score (Arnett DK, et al., 2019) is: 2.2%   Values used to calculate the score:     Age: 40 years     Sex: Female     Is Non-Hispanic African American: Yes     Diabetic: No     Tobacco smoker: No     Systolic Blood Pressure: [id number] mmHg     Is BP treated: No     HDL Cholesterol: 78 mg/dL     Total Cholesterol: 231 mg/dL Chest pain:  no Coronary artery disease:  no Family history CAD:  no Family history early CAD:  no  Depression Screen done today and results listed below:     02/16/2023    8:12 AM 09/06/2022    8:08 AM 03/04/2022    4:02 PM 06/16/2021    8:52 AM 02/12/2020    9:03 AM  Depression screen PHQ 2/9  Decreased Interest 0 0 0 0 0  Down, Depressed, Hopeless 1 1 0 1 0  PHQ - 2 Score 1 1 0 1 0  Altered sleeping 3 0 3  0  Tired, decreased energy 0 0 1  0  Change in appetite 2 0 0  0  Feeling bad or failure about yourself  0 0 0  0  Trouble concentrating 0 0 0  0  Moving slowly or fidgety/restless 0 0 0  0  Suicidal thoughts 0 0 0  0  PHQ-9 Score 6 1 4   0  Difficult doing work/chores Not difficult at all Not difficult at all Not difficult at all      The patient does not have a history of falls. I did complete a risk assessment for falls. A plan of care for falls was documented.   Past Medical History:  Past Medical History:  Diagnosis Date    Hyperlipemia    IgG multiple myeloma ([place]) 02/16/2022    Surgical History:  Past Surgical History:  Procedure Laterality Date   BREAST BIOPSY Left 05/24/2018   neg/ bx/clip/u/s   CESAREAN SECTION     MYOMECTOMY     OOPHORECTOMY     SPINE SURGERY  08/05/2021   TOTAL VAGINAL HYSTERECTOMY     TUBAL LIGATION      Medications:  Current Outpatient Medications on File Prior to Visit  Medication Sig   acetaminophen (TYLENOL) 500 MG tablet Take 500 mg by mouth every 6 (six) hours as needed.   acyclovir (ZOVIRAX) 400 MG tablet Take 400 mg by mouth 2 (two) times daily.   aspirin 81 MG chewable tablet Chew by mouth daily.   ezetimibe (ZETIA) 10 MG tablet TAKE 1 TABLET(10 MG) BY MOUTH DAILY   pregabalin (LYRICA) 50 MG capsule Take 50 mg by mouth 2 (two) times daily.   [DISCONTINUED] fluticasone (FLONASE) 50 MCG/ACT nasal spray Place 2 sprays into both nostrils 2 (two) times daily. (Patient not taking: Reported on 08/15/2019)   No current facility-administered medications on file prior  to visit.    Allergies:  No Known Allergies  Social History:  Social History   Socioeconomic History   Marital status: Single    Spouse name: Not on file   Number of children: Not on file   Years of education: Not on file   Highest education level: Not on file  Occupational History   Not on file  Tobacco Use   Smoking status: Never   Smokeless tobacco: Never  Vaping Use   Vaping Use: Never used  Substance and Sexual Activity   Alcohol use: Yes    Comment: rarely   Drug use: No   Sexual activity: Never  Other Topics Concern   Not on file  Social History Narrative   Not on file   Social Determinants of Health   Financial Resource Strain: Not on file  Food Insecurity: Not on file  Transportation Needs: Not on file  Physical Activity: Not on file  Stress: Not on file  Social Connections: Not on file  Intimate Partner Violence: Not on file   Social History   Tobacco Use  Smoking Status  Never  Smokeless Tobacco Never   Social History   Substance and Sexual Activity  Alcohol Use Yes   Comment: rarely    Family History:  Family History  Problem Relation Age of Onset   Hypertension Mother    Gout Mother    Heart disease Mother    Breast cancer Maternal Aunt 48   Stroke Maternal Aunt    Sickle cell trait Other    Diabetes Other    Alcohol abuse Father    Kidney disease Brother    Heart attack Paternal Aunt    Colon cancer Paternal Uncle    Alcohol abuse Brother    Alcohol abuse Brother    Breast cancer Cousin 14       mat cousin    Past medical history, surgical history, medications, allergies, family history and social history reviewed with patient today and changes made to appropriate areas of the chart.   Review of Systems  Eyes:  Negative for blurred vision and double vision.  Respiratory:  Negative for shortness of breath.   Cardiovascular:  Negative for chest pain, palpitations and leg swelling.  Neurological:  Positive for tingling. Negative for dizziness and headaches.   All other ROS negative except what is listed above and in the HPI.      Objective:    There were no vitals taken for this visit.  Wt Readings from Last 3 Encounters:  02/16/23 158 lb 11.2 oz (72 kg)  09/06/22 148 lb 6.4 oz (67.3 kg)  03/04/22 145 lb 3.2 oz (65.9 kg)    Physical Exam Vitals and nursing note reviewed. Exam conducted with a chaperone present Yvonna Alanis, [place]).  Constitutional:      General: She is awake. She is not in acute distress.    Appearance: She is well-developed. She is not ill-appearing.  HENT:     Head: Normocephalic and atraumatic.     Right Ear: Hearing, tympanic membrane, ear canal and external ear normal. No drainage.     Left Ear: Hearing, tympanic membrane, ear canal and external ear normal. No drainage.     Nose: Nose normal.     Right Sinus: No maxillary sinus tenderness or frontal sinus tenderness.     Left Sinus: No maxillary  sinus tenderness or frontal sinus tenderness.     Mouth/Throat:     Mouth: Mucous membranes are moist.  Pharynx: Oropharynx is clear. Uvula midline. No pharyngeal swelling, oropharyngeal exudate or posterior oropharyngeal erythema.  Eyes:     General: Lids are normal.        Right eye: No discharge.        Left eye: No discharge.     Extraocular Movements: Extraocular movements intact.     Conjunctiva/sclera: Conjunctivae normal.     Pupils: Pupils are equal, round, and reactive to light.     Visual Fields: Right eye visual fields normal and left eye visual fields normal.  Neck:     Thyroid: No thyromegaly.     Vascular: No carotid bruit.     Trachea: Trachea normal.  Cardiovascular:     Rate and Rhythm: Normal rate and regular rhythm.     Heart sounds: Normal heart sounds. No murmur heard.    No gallop.  Pulmonary:     Effort: Pulmonary effort is normal. No accessory muscle usage or respiratory distress.     Breath sounds: Normal breath sounds.  Chest:  Breasts:    Breasts are symmetrical.     Right: Normal.     Left: Normal.  Abdominal:     General: Bowel sounds are normal.     Palpations: Abdomen is soft. There is no hepatomegaly or splenomegaly.     Tenderness: There is no abdominal tenderness.  Musculoskeletal:        General: Normal range of motion.     Cervical back: Normal range of motion and neck supple.     Right lower leg: No edema.     Left lower leg: No edema.  Lymphadenopathy:     Head:     Right side of head: No submental, submandibular, tonsillar, preauricular or posterior auricular adenopathy.     Left side of head: No submental, submandibular, tonsillar, preauricular or posterior auricular adenopathy.     Cervical: No cervical adenopathy.     Upper Body:     Right upper body: No supraclavicular, axillary or pectoral adenopathy.     Left upper body: No supraclavicular, axillary or pectoral adenopathy.  Skin:    General: Skin is warm and dry.      Capillary Refill: Capillary refill takes less than 2 seconds.     Findings: No rash.  Neurological:     Mental Status: She is alert and oriented to person, place, and time.     Gait: Gait is intact.     Deep Tendon Reflexes: Reflexes are normal and symmetric.     Reflex Scores:      Brachioradialis reflexes are 2+ on the right side and 2+ on the left side.      Patellar reflexes are 2+ on the right side and 2+ on the left side. Psychiatric:        Attention and Perception: Attention normal.        Mood and Affect: Mood normal.        Speech: Speech normal.        Behavior: Behavior normal. Behavior is cooperative.        Thought Content: Thought content normal.        Judgment: Judgment normal.     Results for orders placed or performed in visit on 09/06/22  Comp Met (CMET)  Result Value Ref Range   Glucose 86 70 - 99 mg/dL   BUN 11 6 - 24 mg/dL   Creatinine, Ser 0.79 0.57 - 1.00 mg/dL   eGFR 87 >59 mL/min/1.73   BUN/Creatinine Ratio  14 9 - 23   Sodium 141 134 - 144 mmol/L   Potassium 4.5 3.5 - 5.2 mmol/L   Chloride 106 96 - 106 mmol/L   CO2 20 20 - 29 mmol/L   Calcium 8.5 (L) 8.7 - 10.2 mg/dL   Total Protein 6.7 6.0 - 8.5 g/dL   Albumin 3.9 3.8 - 4.9 g/dL   Globulin, Total 2.8 1.5 - 4.5 g/dL   Albumin/Globulin Ratio 1.4 1.2 - 2.2   Bilirubin Total 0.2 0.0 - 1.2 mg/dL   Alkaline Phosphatase 66 44 - 121 IU/L   AST 35 0 - 40 IU/L   ALT 26 0 - 32 IU/L  Lipid Profile  Result Value Ref Range   Cholesterol, Total 231 (H) 100 - 199 mg/dL   Triglycerides 66 0 - 149 mg/dL   HDL 78 >39 mg/dL   VLDL Cholesterol Cal 11 5 - 40 mg/dL   LDL Chol Calc (NIH) 142 (H) 0 - 99 mg/dL   Chol/HDL Ratio 3.0 0.0 - 4.4 ratio      Assessment & Plan:   Problem List Items Addressed This Visit       Cardiovascular and Mediastinum   Aortic atherosclerosis ([place]) - Primary     Other   Hyperlipemia   IgG multiple myeloma ([facility])     Follow up plan: No follow-ups on file.   LABORATORY  TESTING:  - Pap smear: not applicable  IMMUNIZATIONS:   - Tdap: Tetanus vaccination status reviewed: last tetanus booster within 10 years. - Influenza:  not up to date - Pneumovax: Not applicable - Prevnar: Not applicable - COVID: Up to date - HPV: Not applicable - Shingrix vaccine:  Will defer to later date.  SCREENING: -Mammogram: Ordered today  - Colonoscopy: Ordered today  - Bone Density: Not applicable  -Hearing Test: Not applicable  -Spirometry: Not applicable   PATIENT COUNSELING:   Advised to take 1 mg of folate supplement per day if capable of pregnancy.   Sexuality: Discussed sexually transmitted diseases, partner selection, use of condoms, avoidance of unintended pregnancy  and contraceptive alternatives.   Advised to avoid cigarette smoking.  I discussed with the patient that most people either abstain from alcohol or drink within safe limits (<=14/week and <=4 drinks/occasion for males, <=7/weeks and <= 3 drinks/occasion for females) and that the risk for alcohol disorders and other health effects rises proportionally with the number of drinks per week and how often a drinker exceeds daily limits.  Discussed cessation/primary prevention of drug use and availability of treatment for abuse.   Diet: Encouraged to adjust caloric intake to maintain  or achieve ideal body weight, to reduce intake of dietary saturated fat and total fat, to limit sodium intake by avoiding high sodium foods and not adding table salt, and to maintain adequate dietary potassium and calcium preferably from fresh fruits, vegetables, and low-fat dairy products.    stressed the importance of regular exercise  Injury prevention: Discussed safety belts, safety helmets, smoke detector, smoking near bedding or upholstery.   Dental health: Discussed importance of regular tooth brushing, flossing, and dental visits.    NEXT PREVENTATIVE PHYSICAL DUE IN 1 YEAR. No follow-ups on  file.

## 2023-03-07 ENCOUNTER — Encounter: Payer: Managed Care, Other (non HMO) | Admitting: Nurse Practitioner

## 2023-03-07 DIAGNOSIS — I700 Atherosclerosis of aorta: Secondary | ICD-10-CM

## 2023-03-07 DIAGNOSIS — Z0000 Encounter for general adult medical examination without abnormal findings: Secondary | ICD-10-CM

## 2023-03-07 DIAGNOSIS — E785 Hyperlipidemia, unspecified: Secondary | ICD-10-CM

## 2023-03-07 DIAGNOSIS — C9000 Multiple myeloma not having achieved remission: Secondary | ICD-10-CM

## 2023-03-16 ENCOUNTER — Other Ambulatory Visit: Payer: Self-pay | Admitting: Nurse Practitioner

## 2023-03-16 NOTE — Telephone Encounter (Signed)
Requested Prescriptions  Pending Prescriptions Disp Refills   ezetimibe (ZETIA) 10 MG tablet [Pharmacy Med Name: EZETIMIBE 10MG  TABLETS] 90 tablet 0    Sig: TAKE 1 TABLET(10 MG) BY MOUTH DAILY     Cardiovascular:  Antilipid - Sterol Transport Inhibitors Failed - 03/16/2023  7:36 AM      Failed - Lipid Panel in normal range within the last 12 months    Cholesterol, Total  Date Value Ref Range Status  09/06/2022 231 (H) 100 - 199 mg/dL Final   LDL Chol Calc (NIH)  Date Value Ref Range Status  09/06/2022 142 (H) 0 - 99 mg/dL Final   HDL  Date Value Ref Range Status  09/06/2022 78 >39 mg/dL Final   Triglycerides  Date Value Ref Range Status  09/06/2022 66 0 - 149 mg/dL Final         Passed - AST in normal range and within 360 days    AST  Date Value Ref Range Status  09/06/2022 35 0 - 40 IU/L Final         Passed - ALT in normal range and within 360 days    ALT  Date Value Ref Range Status  09/06/2022 26 0 - 32 IU/L Final         Passed - Patient is not pregnant      Passed - Valid encounter within last 12 months    Recent Outpatient Visits           4 weeks ago Numbness and tingling of right upper extremity   [place] [facility] Mecum, Erin E, PA-C   6 months ago Hyperlipidemia, unspecified hyperlipidemia type   [place] Jon Billings, NP   1 year ago Annual physical exam   [place] Jon Billings, NP   1 year ago Aortic atherosclerosis [facility])   [place] Jon Billings, NP   1 year ago Aortic atherosclerosis [facility])   [place] Jon Billings, NP       Future Appointments             In 1 month Jon Billings, NP [place]

## 2023-04-21 ENCOUNTER — Encounter: Payer: Managed Care, Other (non HMO) | Admitting: Nurse Practitioner

## 2023-06-08 ENCOUNTER — Encounter: Payer: Self-pay | Admitting: Physician Assistant

## 2023-06-08 ENCOUNTER — Ambulatory Visit: Payer: Self-pay

## 2023-06-08 ENCOUNTER — Ambulatory Visit (INDEPENDENT_AMBULATORY_CARE_PROVIDER_SITE_OTHER): Payer: Managed Care, Other (non HMO) | Admitting: Physician Assistant

## 2023-06-08 VITALS — BP 111/74 | HR 59 | Ht 60.0 in | Wt 160.4 lb

## 2023-06-08 DIAGNOSIS — S81011D Laceration without foreign body, right knee, subsequent encounter: Secondary | ICD-10-CM | POA: Diagnosis not present

## 2023-06-08 MED ORDER — MUPIROCIN 2 % EX OINT
1.0000 | TOPICAL_OINTMENT | Freq: Two times a day (BID) | CUTANEOUS | 0 refills | Status: AC
Start: 2023-06-08 — End: 2023-06-13

## 2023-06-08 NOTE — Telephone Encounter (Signed)
  Chief Complaint: Lightheaded - fall Symptoms: Felt lightheaded - fell - cut leg Frequency: 1x - sunday Pertinent Negatives: Patient denies recurrent dizziness Disposition: [] ED /[] Urgent Care (no appt availability in office) / [x] Appointment(In office/virtual)/ []  [place] [facility]/ [] Home Care/ [] Refused Recommended Disposition /[] [place] [facility]/ []  Follow-up with PCP Additional Notes: Pt got up to go to the restroom at night. Pt felt lightheaded and fell into the wall. She cut her leg. Pt states that she has had lightheadedness in the past but can't remember when. Pt does not have DM nor HTN.  Reason for Disposition  [1] MILD dizziness (e.g., walking normally) AND [2] has NOT been evaluated by doctor (or NP/PA) for this  (Exception: Dizziness caused by heat exposure, sudden standing, or poor fluid intake.)  Answer Assessment - Initial Assessment Questions 1. DESCRIPTION: "Describe your dizziness."     Lightheaded 2. LIGHTHEADED: "Do you feel lightheaded?" (e.g., somewhat faint, woozy, weak upon standing)     Dizzy 3. VERTIGO: "Do you feel like either you or the room is spinning or tilting?" (i.e. vertigo)     no 4. SEVERITY: "How bad is it?"  "Do you feel like you are going to faint?" "Can you stand and walk?"   - MILD: Feels slightly dizzy, but walking normally.   - MODERATE: Feels unsteady when walking, but not falling; interferes with normal activities (e.g., school, work).   - SEVERE: Unable to walk without falling, or requires assistance to walk without falling; feels like passing out now.      Mild 5. ONSET:  "When did the dizziness begin?"     1x sunday  7. HEART RATE: "Can you tell me your heart rate?" "How many beats in 15 seconds?"  (Note: not all patients can do this)       no 8. CAUSE: "What do you think is causing the dizziness?"     Got up quickly 9. RECURRENT SYMPTOM: "Have you had dizziness before?" If Yes, ask: "When was the last time?" "What happened  that time?"     Cannot remember 10. OTHER SYMPTOMS: "Do you have any other symptoms?" (e.g., fever, chest pain, vomiting, diarrhea, bleeding)       no  Protocols used: Dizziness - Lightheadedness-A-AH

## 2023-06-08 NOTE — Progress Notes (Signed)
Acute Office Visit   Patient: Sara Greer   DOB: 05-03-1961   62 y.o. Female  MRN: [id number] Visit Date: 06/08/2023  Today's healthcare provider: Oswaldo Conroy Billijo Dilling, PA-C  Introduced myself to the patient as a Secondary school teacher and provided education on APPs in clinical practice.    Chief Complaint  Patient presents with   Fall   Knee Problem    Patient says she wanted to fall up on her R knee and says she fell. Patient says she cut her knee when she fell, the mirror fell and scratched her legs.    Subjective    HPI HPI     Knee Problem    Additional comments: Patient says she wanted to fall up on her R knee and says she fell. Patient says she cut her knee when she fell, the mirror fell and scratched her legs.       Last edited by Malen Gauze, CMA on 06/08/2023  3:09 PM.      She reports concerns with right knee and cuts  She reports injury happened Sat night/ Sun morning  States she fell against a wall when she got up from bed to go to the restroom. A mirror fell and the glass cut her knee in the process She went to UC the morning after the incident     Medications: Outpatient Medications Prior to Visit  Medication Sig   acetaminophen (TYLENOL) 500 MG tablet Take 500 mg by mouth every 6 (six) hours as needed.   acyclovir (ZOVIRAX) 400 MG tablet Take 400 mg by mouth 2 (two) times daily.   aspirin 81 MG chewable tablet Chew by mouth daily.   ezetimibe (ZETIA) 10 MG tablet TAKE 1 TABLET(10 MG) BY MOUTH DAILY   pregabalin (LYRICA) 50 MG capsule Take 50 mg by mouth 2 (two) times daily.   No facility-administered medications prior to visit.    Review of Systems  Skin:  Positive for wound.       Multiple lacerations to right knee        Objective    BP 111/74   Pulse (!) 59   Ht 5' (1.524 m)   Wt 160 lb 6.4 oz (72.8 kg)   SpO2 100%   BMI 31.33 kg/m    Physical Exam Vitals reviewed.  Constitutional:      General: She is awake.     Appearance:  Normal appearance. She is well-developed and well-groomed.  HENT:     Head: Normocephalic and atraumatic.  Pulmonary:     Effort: Pulmonary effort is normal.  Skin:         Comments: Multiple lacerations on right knee No swelling, erythema, increased warmth present Mild drainage from one anterior laceration that appears opaque and potentially purulent    Neurological:     Mental Status: She is alert.  Psychiatric:        Behavior: Behavior is cooperative.       No results found for any visits on 06/08/23.  Assessment & Plan      No follow-ups on file.       Problem List Items Addressed This Visit   None Visit Diagnoses     Laceration of right knee, subsequent encounter    -  Primary Acute, ongoing Lacerations appear overall well healing- scant drainage from one anterior laceration  Will provide Mupirocin for abx coverage Reviewed keeping area clean with warm water and gentle soap, allowing  to stay open and using Aquaphor or Vaseline for moisturizing  Reviewed ED and return precautions. Follow up as needed for persistent or progressing symptoms     Relevant Medications   mupirocin ointment (BACTROBAN) 2 %        No follow-ups on file.   I, Caellum Mancil E Danetta Prom, PA-C, have reviewed all documentation for this visit. The documentation on 06/08/23 for the exam, diagnosis, procedures, and orders are all accurate and complete.   Jacquelin Hawking, MHS, PA-C [facility]

## 2023-07-25 ENCOUNTER — Other Ambulatory Visit: Payer: Self-pay | Admitting: Family Medicine

## 2023-07-25 DIAGNOSIS — Z1211 Encounter for screening for malignant neoplasm of colon: Secondary | ICD-10-CM

## 2023-08-02 ENCOUNTER — Ambulatory Visit: Payer: Self-pay

## 2023-08-02 NOTE — Telephone Encounter (Signed)
Chief Complaint: Urine frequency, urgency Symptoms: Cloudy, foul smelling urine, pain/pressure at end of urination Frequency: Saturday or Sunday Pertinent Negatives: Patient denies fever Disposition: [] ED /[x] Urgent Care (no appt availability in office) / [] Appointment(In office/virtual)/ []  [place] [facility]/ [] Home Care/ [] Refused Recommended Disposition /[] [place] [facility]/ []  Follow-up with PCP Additional Notes: No availability with any provider in the office, advised UC, she agrees.   Summary: bladder pressure   Patient states that she has a lot of pressure in her bladder when urinating. Patient said her urine is cloudy.         Reason for Disposition  Urinating more frequently than usual (i.e., frequency)  Answer Assessment - Initial Assessment Questions 1. SYMPTOM: "What's the main symptom you're concerned about?" (e.g., frequency, incontinence)     Urgency, frequency 2. ONSET: "When did the symptoms  start?"     Saturday or Sunday 3. PAIN: "Is there any pain?" If Yes, ask: "How bad is it?" (Scale: 1-10; mild, moderate, severe)     At the end of the stream 7  4. CAUSE: "What do you think is causing the symptoms?"     UTI 5. OTHER SYMPTOMS: "Do you have any other symptoms?" (e.g., blood in urine, fever, flank pain, pain with urination)     Cloudy urine  Protocols used: Urinary Symptoms-A-AH

## 2023-08-18 NOTE — Progress Notes (Signed)
Contacted via MyChart   Good afternoon Sara Greer, your Cologuard returned negative:) Repeat in 3 years.

## 2023-09-16 ENCOUNTER — Other Ambulatory Visit: Payer: Self-pay | Admitting: Nurse Practitioner

## 2023-09-18 NOTE — Telephone Encounter (Signed)
Requested Prescriptions  Pending Prescriptions Disp Refills   ezetimibe (ZETIA) 10 MG tablet [Pharmacy Med Name: EZETIMIBE 10MG  TABLETS] 90 tablet 0    Sig: TAKE 1 TABLET(10 MG) BY MOUTH DAILY     Cardiovascular:  Antilipid - Sterol Transport Inhibitors Failed - 09/16/2023 12:05 PM      Failed - AST in normal range and within 360 days    AST  Date Value Ref Range Status  09/06/2022 35 0 - 40 IU/L Final         Failed - ALT in normal range and within 360 days    ALT  Date Value Ref Range Status  09/06/2022 26 0 - 32 IU/L Final         Failed - Lipid Panel in normal range within the last 12 months    Cholesterol, Total  Date Value Ref Range Status  09/06/2022 231 (H) 100 - 199 mg/dL Final   LDL Chol Calc (NIH)  Date Value Ref Range Status  09/06/2022 142 (H) 0 - 99 mg/dL Final   HDL  Date Value Ref Range Status  09/06/2022 78 >39 mg/dL Final   Triglycerides  Date Value Ref Range Status  09/06/2022 66 0 - 149 mg/dL Final         Passed - Patient is not pregnant      Passed - Valid encounter within last 12 months    Recent Outpatient Visits           3 months ago Laceration of right knee, subsequent encounter   [place] [facility] Mecum, Erin E, PA-C   7 months ago Numbness and tingling of right upper extremity   [place] [facility] Mecum, Erin E, PA-C   1 year ago Hyperlipidemia, unspecified hyperlipidemia type   [place] [facility] Larae Grooms, NP   1 year ago Annual physical exam   [place] [facility] Larae Grooms, NP   2 years ago Aortic atherosclerosis [facility])   [place] [facility] Larae Grooms, NP
